# Patient Record
Sex: Female | Born: 1985 | Race: White | Hispanic: No | Marital: Single | Smoking: Current every day smoker
Health system: Southern US, Community
[De-identification: ages and names within clinical notes are randomized; demographics above are authoritative.]

## PROBLEM LIST (undated history)

## (undated) ENCOUNTER — Inpatient Hospital Stay (HOSPITAL_COMMUNITY): Payer: Self-pay

## (undated) DIAGNOSIS — J45909 Unspecified asthma, uncomplicated: Secondary | ICD-10-CM

## (undated) HISTORY — DX: Unspecified asthma, uncomplicated: J45.909

## (undated) HISTORY — PX: TONSILLECTOMY: SUR1361

---

## 2005-02-02 ENCOUNTER — Inpatient Hospital Stay (HOSPITAL_COMMUNITY): Admission: AD | Admit: 2005-02-02 | Discharge: 2005-02-02 | Payer: Self-pay | Admitting: *Deleted

## 2006-02-08 ENCOUNTER — Encounter: Admission: RE | Admit: 2006-02-08 | Discharge: 2006-02-08 | Payer: Self-pay | Admitting: General Practice

## 2009-01-22 ENCOUNTER — Ambulatory Visit: Payer: Self-pay | Admitting: Diagnostic Radiology

## 2009-01-22 ENCOUNTER — Emergency Department (HOSPITAL_BASED_OUTPATIENT_CLINIC_OR_DEPARTMENT_OTHER): Admission: EM | Admit: 2009-01-22 | Discharge: 2009-01-22 | Payer: Self-pay | Admitting: Emergency Medicine

## 2009-01-23 ENCOUNTER — Ambulatory Visit: Payer: Self-pay | Admitting: Radiology

## 2009-01-23 ENCOUNTER — Emergency Department (HOSPITAL_BASED_OUTPATIENT_CLINIC_OR_DEPARTMENT_OTHER): Admission: EM | Admit: 2009-01-23 | Discharge: 2009-01-23 | Payer: Self-pay | Admitting: Emergency Medicine

## 2009-08-02 ENCOUNTER — Emergency Department: Payer: Self-pay | Admitting: Emergency Medicine

## 2010-01-20 ENCOUNTER — Inpatient Hospital Stay (HOSPITAL_COMMUNITY): Admission: AD | Admit: 2010-01-20 | Discharge: 2010-01-23 | Payer: Self-pay | Admitting: Obstetrics and Gynecology

## 2010-08-06 LAB — CBC
HCT: 26.8 % — ABNORMAL LOW (ref 36.0–46.0)
Hemoglobin: 9.1 g/dL — ABNORMAL LOW (ref 12.0–15.0)
MCH: 29.6 pg (ref 26.0–34.0)
MCV: 87.5 fL (ref 78.0–100.0)
RBC: 3.06 MIL/uL — ABNORMAL LOW (ref 3.87–5.11)
WBC: 31.2 10*3/uL — ABNORMAL HIGH (ref 4.0–10.5)

## 2010-08-07 LAB — CBC
HCT: 33.3 % — ABNORMAL LOW (ref 36.0–46.0)
Hemoglobin: 11.1 g/dL — ABNORMAL LOW (ref 12.0–15.0)
MCH: 29.1 pg (ref 26.0–34.0)
MCHC: 33.4 g/dL (ref 30.0–36.0)
MCV: 87.1 fL (ref 78.0–100.0)
RDW: 12.3 % (ref 11.5–15.5)

## 2010-08-28 LAB — URINALYSIS, ROUTINE W REFLEX MICROSCOPIC
Bilirubin Urine: NEGATIVE
Glucose, UA: NEGATIVE mg/dL
Ketones, ur: >80 mg/dL — AB
Nitrite: NEGATIVE
Specific Gravity, Urine: 1.018 (ref 1.005–1.030)
pH: 6.5 (ref 5.0–8.0)

## 2010-08-28 LAB — DIFFERENTIAL
Basophils Absolute: 0.2 10*3/uL — ABNORMAL HIGH (ref 0.0–0.1)
Eosinophils Absolute: 0.2 10*3/uL (ref 0.0–0.7)
Lymphocytes Relative: 9 % — ABNORMAL LOW (ref 12–46)
Monocytes Relative: 7 % (ref 3–12)
Neutrophils Relative %: 82 % — ABNORMAL HIGH (ref 43–77)

## 2010-08-28 LAB — CBC
MCHC: 33.9 g/dL (ref 30.0–36.0)
RDW: 11.6 % (ref 11.5–15.5)

## 2010-08-28 LAB — BASIC METABOLIC PANEL
BUN: 10 mg/dL (ref 6–23)
Calcium: 9.9 mg/dL (ref 8.4–10.5)
GFR calc non Af Amer: >60 mL/min (ref >60)
Glucose, Bld: 125 mg/dL — ABNORMAL HIGH (ref 70–99)
Potassium: 3.3 mEq/L — ABNORMAL LOW (ref 3.5–5.1)
Sodium: 140 mEq/L (ref 135–145)

## 2010-08-28 LAB — URINE MICROSCOPIC-ADD ON

## 2010-08-28 LAB — PREGNANCY, URINE

## 2010-08-28 LAB — D-DIMER, QUANTITATIVE

## 2010-08-28 LAB — PROTIME-INR
INR: 1.1 (ref 0.00–1.49)
Prothrombin Time: 14.3 seconds (ref 11.6–15.2)

## 2012-07-21 ENCOUNTER — Emergency Department (HOSPITAL_COMMUNITY): Payer: No Typology Code available for payment source

## 2012-07-21 ENCOUNTER — Encounter (HOSPITAL_COMMUNITY): Payer: Self-pay | Admitting: Emergency Medicine

## 2012-07-21 ENCOUNTER — Emergency Department (HOSPITAL_COMMUNITY)
Admission: EM | Admit: 2012-07-21 | Discharge: 2012-07-21 | Disposition: A | Discharge status: Home / Self Care | Payer: No Typology Code available for payment source | Attending: Emergency Medicine | Admitting: Emergency Medicine

## 2012-07-21 DIAGNOSIS — Y92410 Unspecified street and highway as the place of occurrence of the external cause: Secondary | ICD-10-CM | POA: Insufficient documentation

## 2012-07-21 DIAGNOSIS — R51 Headache: Secondary | ICD-10-CM | POA: Insufficient documentation

## 2012-07-21 DIAGNOSIS — S335XXA Sprain of ligaments of lumbar spine, initial encounter: Secondary | ICD-10-CM | POA: Insufficient documentation

## 2012-07-21 DIAGNOSIS — Y9389 Activity, other specified: Secondary | ICD-10-CM | POA: Insufficient documentation

## 2012-07-21 MED ORDER — IBUPROFEN 800 MG PO TABS: 800.0000 mg | ORAL_TABLET | Freq: Three times a day (TID) | ORAL | Status: DC

## 2012-07-21 MED ORDER — IBUPROFEN 800 MG PO TABS
800.0000 mg | ORAL_TABLET | Freq: Once | ORAL | Status: AC
  Administered 2012-07-21: 800 mg via ORAL
  Filled 2012-07-21: qty 1

## 2012-07-21 NOTE — Progress Notes (Signed)
During [facility] ED 07/21/12 visit pt was seen by [facility] liaison  Pt offered services to assist with finding a [place] self pay provider, resources & health reform information  
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
## 2012-07-21 NOTE — ED Notes (Signed)
Pt ambulated to restroom. 

## 2012-07-21 NOTE — ED Provider Notes (Signed)
Medical screening examination/treatment/procedure(s) were performed by non-physician practitioner and as supervising physician I was immediately available for consultation/collaboration.  Ethelda Chick, MD 07/21/12 2006

## 2012-08-22 ENCOUNTER — Emergency Department (HOSPITAL_COMMUNITY)
Admission: EM | Admit: 2012-08-22 | Discharge: 2012-08-22 | Disposition: A | Discharge status: Home / Self Care | Payer: Medicaid Other | Attending: Emergency Medicine | Admitting: Emergency Medicine

## 2012-08-22 ENCOUNTER — Encounter (HOSPITAL_COMMUNITY): Payer: Self-pay | Admitting: *Deleted

## 2012-08-22 ENCOUNTER — Emergency Department (HOSPITAL_COMMUNITY): Payer: Medicaid Other

## 2012-08-22 DIAGNOSIS — J3489 Other specified disorders of nose and nasal sinuses: Secondary | ICD-10-CM | POA: Insufficient documentation

## 2012-08-22 DIAGNOSIS — B9789 Other viral agents as the cause of diseases classified elsewhere: Secondary | ICD-10-CM | POA: Insufficient documentation

## 2012-08-22 DIAGNOSIS — R52 Pain, unspecified: Secondary | ICD-10-CM | POA: Insufficient documentation

## 2012-08-22 DIAGNOSIS — R112 Nausea with vomiting, unspecified: Secondary | ICD-10-CM | POA: Insufficient documentation

## 2012-08-22 DIAGNOSIS — R51 Headache: Secondary | ICD-10-CM | POA: Insufficient documentation

## 2012-08-22 DIAGNOSIS — R05 Cough: Secondary | ICD-10-CM | POA: Insufficient documentation

## 2012-08-22 DIAGNOSIS — B349 Viral infection, unspecified: Secondary | ICD-10-CM

## 2012-08-22 DIAGNOSIS — R5381 Other malaise: Secondary | ICD-10-CM | POA: Insufficient documentation

## 2012-08-22 DIAGNOSIS — R059 Cough, unspecified: Secondary | ICD-10-CM | POA: Insufficient documentation

## 2012-08-22 LAB — URINALYSIS, ROUTINE W REFLEX MICROSCOPIC
Glucose, UA: NEGATIVE mg/dL
Ketones, ur: >80 mg/dL — AB
pH: 6 (ref 5.0–8.0)

## 2012-08-22 LAB — POCT I-STAT, CHEM 8
BUN: 11 mg/dL (ref 6–23)
Calcium, Ion: 1.21 mmol/L (ref 1.12–1.23)
Chloride: 103 meq/L (ref 96–112)
Creatinine, Ser: 0.8 mg/dL (ref 0.50–1.10)
Glucose, Bld: 121 mg/dL — ABNORMAL HIGH (ref 70–99)
HCT: 41 % (ref 36.0–46.0)
Hemoglobin: 13.9 g/dL (ref 12.0–15.0)
Potassium: 3.7 meq/L (ref 3.5–5.1)
Sodium: 137 meq/L (ref 135–145)
TCO2: 24 mmol/L (ref 0–100)

## 2012-08-22 LAB — URINE MICROSCOPIC-ADD ON

## 2012-08-22 MED ORDER — ONDANSETRON 4 MG PO TBDP
4.0000 mg | ORAL_TABLET | Freq: Once | ORAL | Status: AC
  Administered 2012-08-22: 4 mg via ORAL
  Filled 2012-08-22: qty 1

## 2012-08-22 MED ORDER — KETOROLAC TROMETHAMINE 30 MG/ML IJ SOLN
30.0000 mg | Freq: Once | INTRAMUSCULAR | Status: AC
  Administered 2012-08-22: 30 mg via INTRAMUSCULAR
  Filled 2012-08-22: qty 1

## 2012-08-22 MED ORDER — KETOROLAC TROMETHAMINE 30 MG/ML IJ SOLN: 30.0000 mg | Freq: Once | INTRAMUSCULAR | Status: DC

## 2012-08-22 MED ORDER — PROMETHAZINE HCL 25 MG PO TABS: 25.0000 mg | ORAL_TABLET | Freq: Four times a day (QID) | ORAL | Status: DC | PRN

## 2012-08-22 NOTE — ED Notes (Addendum)
Pt reports that she started with cough, congestion and fever yesterday.  She also vomited. Her daughter is here with the same symptoms.  Last emesis was yesterday.  Pt also with complaints of headache and backache.  Pt is alert, answers questions appropriately.

## 2012-08-22 NOTE — ED Notes (Addendum)
Pt reports n/v denies diarrhea, symptoms began late last night and she has not been able to keep anything down since.  Pt has chills, headache and lower back pain.  Pt daughter was just seen and released from peds ED for same symptoms.  Lungs clear, pt reports cough.  Pt alert oriented X4

## 2012-08-22 NOTE — ED Provider Notes (Signed)
History     CSN: [id number]  Arrival date & time 08/22/12  9629   First MD Initiated Contact with Patient 08/22/12 1116      Chief Complaint  Patient presents with  . Fever  . Influenza  . Emesis    (Consider location/radiation/quality/duration/timing/severity/associated sxs/prior treatment) HPI Comments: Patient presents with a chief complaint of cough, nasal congestion, body aches, headache, and fever.  Symptoms began yesterday and are gradually worsening.  She has not taken her temperature at home.  Temperature upon arrival 100.3 F orally.  She also had one episode of vomiting yesterday.  No vomiting today.  She denies abdominal pain.  Denies diarrhea.  No neck pain or stiffness.  No vision changes.  She has taken Mucinex for her symptoms with mild relief.  Her daughter has had similar symptoms.    The history is provided by the patient.    History reviewed. No pertinent past medical history.  History reviewed. No pertinent past surgical history.  History reviewed. No pertinent family history.  History  Substance Use Topics  . Smoking status: Not on file  . Smokeless tobacco: Not on file  . Alcohol Use: Not on file    OB History   Grav Para Term Preterm Abortions TAB SAB Ect Mult Living                  Review of Systems  Constitutional: Positive for fever, chills, appetite change and fatigue.  HENT: Positive for congestion and rhinorrhea. Negative for neck pain and neck stiffness.   Respiratory: Positive for cough. Negative for shortness of breath.   Gastrointestinal: Positive for nausea and vomiting. Negative for abdominal pain, diarrhea and blood in stool.  Genitourinary: Negative for dysuria, urgency, frequency, hematuria, vaginal bleeding and vaginal discharge.  Skin: Negative for rash.  Neurological: Positive for headaches. Negative for dizziness, syncope and light-headedness.    Allergies  Review of patient's allergies indicates no known allergies.  Home  Medications   Current Outpatient Rx  Name  Route  Sig  Dispense  Refill  . acetaminophen (TYLENOL) 500 MG tablet   Oral   Take 1,000 mg by mouth every 4 (four) hours as needed for pain.         Marland Kitchen Phenylephrine-DM-GG-APAP (MUCINEX FAST-MAX COLD FLU PO)   Oral   Take 1 Package by mouth 2 (two) times daily as needed (for congestion and flu symptoms).           BP 104/54  Pulse 100  Temp(Src) 100.3 F (37.9 C) (Oral)  Resp 20  SpO2 97%  Physical Exam  Nursing note and vitals reviewed. Constitutional: She is oriented to person, place, and time. She appears well-developed and well-nourished. No distress.  HENT:  Head: Normocephalic and atraumatic.  Right Ear: Hearing, tympanic membrane and ear canal normal.  Left Ear: Hearing, tympanic membrane and ear canal normal.  Nose: Mucosal edema and rhinorrhea present.  Mouth/Throat: Uvula is midline, oropharynx is clear and moist and mucous membranes are normal. No oropharyngeal exudate, posterior oropharyngeal edema or posterior oropharyngeal erythema.  Eyes: EOM are normal. Pupils are equal, round, and reactive to light.  Neck: Normal range of motion. Neck supple.  Cardiovascular: Normal rate, regular rhythm and normal heart sounds.   Pulmonary/Chest: Effort normal and breath sounds normal.  Abdominal: Soft. Bowel sounds are normal. She exhibits no distension and no mass. There is no tenderness. There is no rebound, no guarding and no CVA tenderness.  Musculoskeletal: Normal range of  motion.  Neurological: She is alert and oriented to person, place, and time. She has normal strength. No cranial nerve deficit or sensory deficit. Gait normal.  Skin: Skin is warm and dry. She is not diaphoretic.  Psychiatric: She has a normal mood and affect.    ED Course  Procedures (including critical care time)  Labs Reviewed  URINALYSIS, ROUTINE W REFLEX MICROSCOPIC - Abnormal; Notable for the following:    Color, Urine AMBER (*)    APPearance  CLOUDY (*)    Specific Gravity, Urine 1.035 (*)    Hgb urine dipstick SMALL (*)    Bilirubin Urine SMALL (*)    Ketones, ur >80 (*)    Protein, ur 30 (*)    Leukocytes, UA TRACE (*)    All other components within normal limits  URINE MICROSCOPIC-ADD ON - Abnormal; Notable for the following:    Squamous Epithelial / LPF MANY (*)    Bacteria, UA FEW (*)    All other components within normal limits  POCT I-STAT, CHEM 8 - Abnormal; Notable for the following:    Glucose, Bld 121 (*)    All other components within normal limits   Dg Chest 2 View  08/22/2012  *RADIOLOGY REPORT*  Clinical Data: Cough, congestion, fever  CHEST - 2 VIEW  Comparison:  01/23/2009  Findings:  The heart size and mediastinal contours are within normal limits.  Both lungs are clear.  The visualized skeletal structures are unremarkable.  IMPRESSION: No active cardiopulmonary disease.   Original Report Authenticated By: Judie Petit. Miles Costain, M.D.      No diagnosis found.  Reassessed patient.  She reports that she is feeling much better.  Patient able to tolerate PO liquids.  MDM  Patient presenting with fever, cough, and nasal congestion since yesterday.  CXR negative.  No infection on UA.  She did have small hemoglobin on her UA, but she reports that she has been spotting.  I-stat 8 unremarkable.  Patient given Zofran and symptoms improved.  Patient able to tolerate PO liquids.  Patient non toxic appearing.  Patient appears stable for discharge.  Return precautions given.        Pascal Lux [place], PA-C 08/23/12 [phone]

## 2012-08-22 NOTE — ED Notes (Signed)
Pt passed fluid challenge as she has been able to keep 4oz of pepsi down during visit to ED
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
## 2012-08-23 NOTE — ED Provider Notes (Signed)
Medical screening examination/treatment/procedure(s) were performed by non-physician practitioner and as supervising physician I was immediately available for consultation/collaboration.   Pleas Carneal Y. Casimer Russett, MD 08/23/12 2128 

## 2012-08-31 NOTE — ED Provider Notes (Signed)
Medical screening examination/treatment/procedure(s) were performed by non-physician practitioner and as supervising physician I was immediately available for consultation/collaboration.  Jeniyah Menor K Linker, MD 08/31/12 1721 

## 2013-11-15 ENCOUNTER — Encounter (HOSPITAL_COMMUNITY): Payer: Self-pay | Admitting: Emergency Medicine

## 2013-11-15 ENCOUNTER — Emergency Department (HOSPITAL_COMMUNITY)
Admission: EM | Admit: 2013-11-15 | Discharge: 2013-11-16 | Disposition: A | Discharge status: Home / Self Care | Payer: Medicaid Other | Attending: Emergency Medicine | Admitting: Emergency Medicine

## 2013-11-15 DIAGNOSIS — B085 Enteroviral vesicular pharyngitis: Secondary | ICD-10-CM

## 2013-11-15 DIAGNOSIS — F17200 Nicotine dependence, unspecified, uncomplicated: Secondary | ICD-10-CM | POA: Insufficient documentation

## 2013-11-15 DIAGNOSIS — N390 Urinary tract infection, site not specified: Secondary | ICD-10-CM | POA: Insufficient documentation

## 2013-11-15 NOTE — ED Notes (Signed)
Pt states yesterday when she came out of class she started having chills  Pt states since then she has been running a fever  Pt states this morning she got in the shower and started having vomiting  Pt states she went to class but left early because she was having back pain  Pt states this afternoon she started having pain in her left ear and has a sore throat where it hurts to swallow  Pt states she has had a decreased appetite and has felt weak

## 2013-11-16 LAB — CBC WITH DIFFERENTIAL/PLATELET
BASOS PCT: 0 % (ref 0–1)
Basophils Absolute: 0 10*3/uL (ref 0.0–0.1)
EOS ABS: 0 10*3/uL (ref 0.0–0.7)
EOS PCT: 0 % (ref 0–5)
HCT: 36.8 % (ref 36.0–46.0)
Hemoglobin: 12.6 g/dL (ref 12.0–15.0)
Lymphocytes Relative: 6 % — ABNORMAL LOW (ref 12–46)
Lymphs Abs: 1.3 10*3/uL (ref 0.7–4.0)
MCH: 28.1 pg (ref 26.0–34.0)
MCHC: 34.2 g/dL (ref 30.0–36.0)
MCV: 82.1 fL (ref 78.0–100.0)
MONOS PCT: 8 % (ref 3–12)
Monocytes Absolute: 1.7 10*3/uL — ABNORMAL HIGH (ref 0.1–1.0)
NEUTROS PCT: 86 % — AB (ref 43–77)
Neutro Abs: 18.8 10*3/uL — ABNORMAL HIGH (ref 1.7–7.7)
PLATELETS: 310 10*3/uL (ref 150–400)
RBC: 4.48 MIL/uL (ref 3.87–5.11)
RDW: 12.5 % (ref 11.5–15.5)
WBC: 21.8 10*3/uL — ABNORMAL HIGH (ref 4.0–10.5)

## 2013-11-16 LAB — URINALYSIS, ROUTINE W REFLEX MICROSCOPIC
BILIRUBIN URINE: NEGATIVE
Glucose, UA: NEGATIVE mg/dL
KETONES UR: 15 mg/dL — AB
NITRITE: NEGATIVE
PROTEIN: NEGATIVE mg/dL
Specific Gravity, Urine: 1.018 (ref 1.005–1.030)
Urobilinogen, UA: 1 mg/dL (ref 0.0–1.0)
pH: 7 (ref 5.0–8.0)

## 2013-11-16 LAB — URINE MICROSCOPIC-ADD ON

## 2013-11-16 LAB — BASIC METABOLIC PANEL
BUN: 7 mg/dL (ref 6–23)
CALCIUM: 9.2 mg/dL (ref 8.4–10.5)
CO2: 20 mEq/L (ref 19–32)
Chloride: 97 mEq/L (ref 96–112)
Creatinine, Ser: 0.59 mg/dL (ref 0.50–1.10)
GLUCOSE: 85 mg/dL (ref 70–99)
POTASSIUM: 3.5 meq/L — AB (ref 3.7–5.3)
SODIUM: 133 meq/L — AB (ref 137–147)

## 2013-11-16 LAB — POC URINE PREG, ED: Preg Test, Ur: NEGATIVE

## 2013-11-16 MED ORDER — SODIUM CHLORIDE 0.9 % IV BOLUS (SEPSIS)
1000.0000 mL | Freq: Once | INTRAVENOUS | Status: AC
  Administered 2013-11-16: 1000 mL via INTRAVENOUS

## 2013-11-16 MED ORDER — CEFTRIAXONE SODIUM 1 G IJ SOLR
1.0000 g | Freq: Once | INTRAMUSCULAR | Status: AC
  Administered 2013-11-16: 1 g via INTRAVENOUS
  Filled 2013-11-16: qty 10

## 2013-11-16 MED ORDER — ACETAMINOPHEN 325 MG PO TABS
650.0000 mg | ORAL_TABLET | Freq: Once | ORAL | Status: AC
  Administered 2013-11-16: 650 mg via ORAL
  Filled 2013-11-16: qty 2

## 2013-11-16 MED ORDER — KETOROLAC TROMETHAMINE 30 MG/ML IJ SOLN
30.0000 mg | Freq: Once | INTRAMUSCULAR | Status: AC
  Administered 2013-11-16: 30 mg via INTRAVENOUS
  Filled 2013-11-16: qty 1

## 2013-11-16 MED ORDER — CEPHALEXIN 500 MG PO CAPS: 500.0000 mg | ORAL_CAPSULE | Freq: Four times a day (QID) | ORAL | Status: DC

## 2013-11-16 MED ORDER — IBUPROFEN 800 MG PO TABS
800.0000 mg | ORAL_TABLET | Freq: Once | ORAL | Status: DC
  Filled 2013-11-16: qty 1

## 2013-11-16 MED ORDER — FLUCONAZOLE 200 MG PO TABS: 200.0000 mg | ORAL_TABLET | Freq: Every day | ORAL | Status: AC

## 2013-11-16 NOTE — ED Provider Notes (Signed)
CSN: [id number]     Arrival date & time 11/15/13  2229 History   First MD Initiated Contact with Patient 11/16/13 0030     Chief Complaint  Patient presents with  . Chills  . Otalgia   HPI  History provided by patient. Patient is a 28 year old female with history of tonsillectomy presenting with complaints of fever, chills, sore throat and bilateral lower back pains. Symptoms first began yesterday and have been progressively worsening today. She reports continue normal diet. Denies any congestion or cough symptoms. She has severe sore throat worse with swallowing. Also complains of lower back pain. She did use some Tylenol or the day for 16 to help with her back and throat pain. She has not traveled anywhere recently. Denies any known sick contacts. Denies any associated vomiting or diarrhea. No dysuria, hematuria or urinary frequency.    History reviewed. No pertinent past medical history. Past Surgical History  Procedure Laterality Date  . Tonsillectomy     History reviewed. No pertinent family history. History  Substance Use Topics  . Smoking status: Current Every Day Smoker  . Smokeless tobacco: Not on file  . Alcohol Use: No   OB History   Grav Para Term Preterm Abortions TAB SAB Ect Mult Living                 Review of Systems  Constitutional: Positive for fever, chills and appetite change.  HENT: Positive for ear pain and sore throat. Negative for congestion and nosebleeds.   Respiratory: Negative for cough.   Gastrointestinal: Negative for nausea, vomiting, abdominal pain, diarrhea, constipation and blood in stool.  Genitourinary: Negative for dysuria, frequency, hematuria, flank pain, vaginal bleeding and vaginal discharge.  Musculoskeletal: Positive for back pain.  All other systems reviewed and are negative.     Allergies  Review of patient's allergies indicates no known allergies.  Home Medications   Prior to Admission medications   Medication Sig Start  Date End Date Taking? Authorizing Provider  acetaminophen (TYLENOL) 500 MG tablet Take 1,000 mg by mouth every 4 (four) hours as needed for pain.    Historical Provider, MD  Phenylephrine-DM-GG-APAP (MUCINEX FAST-MAX COLD FLU PO) Take 1 Package by mouth 2 (two) times daily as needed (for congestion and flu symptoms).    Historical Provider, MD  promethazine (PHENERGAN) 25 MG tablet Take 1 tablet (25 mg total) by mouth every 6 (six) hours as needed for nausea. 08/22/12   Heather Laisure, PA-C   BP 119/66  Pulse 99  Temp(Src) 100.7 F (38.2 C) (Oral)  Resp 18  Ht 5' (1.524 m)  Wt 102 lb (46.267 kg)  BMI 19.92 kg/m2  SpO2 99%  LMP 10/11/2013 Physical Exam  Nursing note and vitals reviewed. Constitutional: She is oriented to person, place, and time. She appears well-developed and well-nourished. No distress.  HENT:  Head: Normocephalic.  Right Ear: Tympanic membrane normal.  Left Ear: Tympanic membrane normal.  Pharynx is erythematous with several ulcerative lesions. No exudate. No vesicular petechiae. Normal tongue and lips.  Neck: Normal range of motion. Neck supple.  No meningeal signs  Cardiovascular: Normal rate and regular rhythm.   Pulmonary/Chest: Effort normal and breath sounds normal. No respiratory distress. She has no wheezes. She has no rales.  Abdominal: Soft. There is no tenderness. There is no rebound and no guarding.  No CVA tenderness  Musculoskeletal: Normal range of motion.       Thoracic back: Normal.       Lumbar  back: Normal.  Lymphadenopathy:    She has no cervical adenopathy.  Neurological: She is alert and oriented to person, place, and time.  Skin: Skin is warm and dry. No rash noted.  Psychiatric: She has a normal mood and affect. Her behavior is normal.    ED Course  Procedures   COORDINATION OF CARE:  Nursing notes reviewed. Vital signs reviewed. Initial pt interview and examination performed.   Filed Vitals:   11/15/13 2311 11/15/13 2344  BP:  112/73 119/66  Pulse: 94 99  Temp: 100.3 F (37.9 C) 100.7 F (38.2 C)  TempSrc: Oral Oral  Resp: 16 18  Height:  5' (1.524 m)  Weight:  102 lb (46.267 kg)  SpO2: 98% 99%    1:09 AM-patient seen and evaluated. Patient appears uncomfortable. She has low-grade fever. Does not appear severely ill or toxic. Does not appear dehydrated.  Patient reports significant improvements after IV fluids and Toradol. Her examination is concerning for angina. Laboratory testing and UA pending.  UA is concerning for a UTI. Rocephin ordered. Patient also with elevated WBC. She is now feeling significantly better. Did not have any significant CVA tenderness. There is possibilities for early pyelonephritis. She does also have a few yeast and UA. Denies any vaginal discharge or bleeding. At this time she appears well and stable for discharge home with continued outpatient treatment. Will plan for prescription of Keflex with Diflucan. She agrees with this plan. Strict return precautions given.     Treatment plan initiated: Medications  sodium chloride 0.9 % bolus 1,000 mL (0 mLs Intravenous Stopped 11/16/13 0220)  ketorolac (TORADOL) 30 MG/ML injection 30 mg (30 mg Intravenous Given 11/16/13 0126)  cefTRIAXone (ROCEPHIN) 1 g in dextrose 5 % 50 mL IVPB (0 g Intravenous Stopped 11/16/13 0245)  acetaminophen (TYLENOL) tablet 650 mg (650 mg Oral Given 11/16/13 0215)    Results for orders placed during the hospital encounter of 11/15/13  URINALYSIS, ROUTINE W REFLEX MICROSCOPIC      Result Value Ref Range   Color, Urine YELLOW  YELLOW   APPearance CLOUDY (*) CLEAR   Specific Gravity, Urine 1.018  1.005 - 1.030   pH 7.0  5.0 - 8.0   Glucose, UA NEGATIVE  NEGATIVE mg/dL   Hgb urine dipstick SMALL (*) NEGATIVE   Bilirubin Urine NEGATIVE  NEGATIVE   Ketones, ur 15 (*) NEGATIVE mg/dL   Protein, ur NEGATIVE  NEGATIVE mg/dL   Urobilinogen, UA 1.0  0.0 - 1.0 mg/dL   Nitrite NEGATIVE  NEGATIVE   Leukocytes, UA  LARGE (*) NEGATIVE  CBC WITH DIFFERENTIAL      Result Value Ref Range   WBC 21.8 (*) 4.0 - 10.5 K/uL   RBC 4.48  3.87 - 5.11 MIL/uL   Hemoglobin 12.6  12.0 - 15.0 g/dL   HCT 40.936.8  81.136.0 - 91.446.0 %   MCV 82.1  78.0 - 100.0 fL   MCH 28.1  26.0 - 34.0 pg   MCHC 34.2  30.0 - 36.0 g/dL   RDW 78.212.5  95.611.5 - 21.315.5 %   Platelets 310  150 - 400 K/uL   Neutrophils Relative % 86 (*) 43 - 77 %   Neutro Abs 18.8 (*) 1.7 - 7.7 K/uL   Lymphocytes Relative 6 (*) 12 - 46 %   Lymphs Abs 1.3  0.7 - 4.0 K/uL   Monocytes Relative 8  3 - 12 %   Monocytes Absolute 1.7 (*) 0.1 - 1.0 K/uL   Eosinophils Relative  0  0 - 5 %   Eosinophils Absolute 0.0  0.0 - 0.7 K/uL   Basophils Relative 0  0 - 1 %   Basophils Absolute 0.0  0.0 - 0.1 K/uL  BASIC METABOLIC PANEL      Result Value Ref Range   Sodium 133 (*) 137 - 147 mEq/L   Potassium 3.5 (*) 3.7 - 5.3 mEq/L   Chloride 97  96 - 112 mEq/L   CO2 20  19 - 32 mEq/L   Glucose, Bld 85  70 - 99 mg/dL   BUN 7  6 - 23 mg/dL   Creatinine, Ser 1.610.59  0.50 - 1.10 mg/dL   Calcium 9.2  8.4 - 09.610.5 mg/dL   GFR calc non Af Amer >90  >90 mL/min   GFR calc Af Amer >90  >90 mL/min  URINE MICROSCOPIC-ADD ON      Result Value Ref Range   Squamous Epithelial / LPF MANY (*) RARE   WBC, UA 11-20  <3 WBC/hpf   RBC / HPF 0-2  <3 RBC/hpf   Bacteria, UA MANY (*) RARE   Urine-Other MUCOUS PRESENT    POC URINE PREG, ED      Result Value Ref Range   Preg Test, Ur NEGATIVE  NEGATIVE      MDM   Final diagnoses:  Herpangina  UTI (lower urinary tract infection)       Angus SellerPeter S Dammen, PA-C 11/16/13 [phone]

## 2013-11-16 NOTE — Discharge Instructions (Signed)
Continues to take Tylenol and ibuprofen for fever and pains. Complaining of fluids to stay hydrated. Followup with a primary care provider for continued evaluation and treatment. Return anytime for changing or worsening symptoms.    Herpangina  Herpangina is a viral illness that causes sores inside the mouth and throat. It can be passed from person to person (contagious). Most cases of herpangina occur in the summer. CAUSES  Herpangina is caused by a virus. This virus can be spread by saliva and mouth-to-mouth contact. It can also be spread through contact with an infected person's stools. It usually takes 3 to 6 days after exposure to show signs of infection. SYMPTOMS   Fever.  Very sore, red throat.  Small blisters in the back of the throat.  Sores inside the mouth, lips, cheeks, and in the throat.  Blisters around the outside of the mouth.  Painful blisters on the palms of the hands and soles of the feet.  Irritability.  Poor appetite.  Dehydration. DIAGNOSIS  This diagnosis is made by a physical exam. Lab tests are usually not required. TREATMENT  This illness normally goes away on its own within 1 week. Medicines may be given to ease your symptoms. HOME CARE INSTRUCTIONS   Avoid salty, spicy, or acidic food and drinks. These foods may make your sores more painful.  If the patient is a baby or young child, weigh your child daily to check for dehydration. Rapid weight loss indicates there is not enough fluid intake. Consult your caregiver immediately.  Ask your caregiver for specific rehydration instructions.  Only take over-the-counter or prescription medicines for pain, discomfort, or fever as directed by your caregiver. SEEK IMMEDIATE MEDICAL CARE IF:   Your pain is not relieved with medicine.  You have signs of dehydration, such as dry lips and mouth, dizziness, dark urine, confusion, or a rapid pulse. MAKE SURE YOU:  Understand these instructions.  Will watch  your condition.  Will get help right away if you are not doing well or get worse. Document Released: 02/06/2003 Document Revised: 08/02/2011 Document Reviewed: 11/30/2010 [facility] Patient Information 2015 [place] [employer], [place]. This information is not intended to replace advice given to you by your health care provider. Make sure you discuss any questions you have with your health care provider.   Urinary Tract Infection A urinary tract infection (UTI) can occur any place along the urinary tract. The tract includes the kidneys, ureters, bladder, and urethra. A type of germ called bacteria often causes a UTI. UTIs are often helped with antibiotic medicine.  HOME CARE   If given, take antibiotics as told by your doctor. Finish them even if you start to feel better.  Drink enough fluids to keep your pee (urine) clear or pale yellow.  Avoid tea, drinks with caffeine, and bubbly (carbonated) drinks.  Pee often. Avoid holding your pee in for a long time.  Pee before and after having sex (intercourse).  Wipe from front to back after you poop (bowel movement) if you are a woman. Use each tissue only once. GET HELP RIGHT AWAY IF:   You have back pain.  You have lower belly (abdominal) pain.  You have chills.  You feel sick to your stomach (nauseous).  You throw up (vomit).  Your burning or discomfort with peeing does not go away.  You have a fever.  Your symptoms are not better in 3 days. MAKE SURE YOU:   Understand these instructions.  Will watch your condition.  Will get help  right away if you are not doing well or get worse. Document Released: 10/27/2007 Document Revised: 02/02/2012 Document Reviewed: 12/09/2011 [facility] Patient Information 2015 [place], [place]. This information is not intended to replace advice given to you by your health care provider. Make sure you discuss any questions you have with your health care provider.

## 2013-11-16 NOTE — ED Notes (Signed)
Pt is in room crying states her lower back pain has gotten worse and that she is now having pain in her lower left abdomen that feels like cramping

## 2013-11-18 NOTE — ED Provider Notes (Signed)
Medical screening examination/treatment/procedure(s) were performed by non-physician practitioner and as supervising physician I was immediately available for consultation/collaboration.   Candyce ChurnJohn David Wofford III, MD 11/18/13 [id number]

## 2015-03-17 ENCOUNTER — Emergency Department
Admission: EM | Admit: 2015-03-17 | Discharge: 2015-03-17 | Disposition: A | Discharge status: Home / Self Care | Payer: No Typology Code available for payment source | Attending: Emergency Medicine | Admitting: Emergency Medicine

## 2015-03-17 ENCOUNTER — Encounter: Payer: Self-pay | Admitting: *Deleted

## 2015-03-17 DIAGNOSIS — S199XXA Unspecified injury of neck, initial encounter: Secondary | ICD-10-CM | POA: Diagnosis not present

## 2015-03-17 DIAGNOSIS — S39012A Strain of muscle, fascia and tendon of lower back, initial encounter: Secondary | ICD-10-CM | POA: Insufficient documentation

## 2015-03-17 DIAGNOSIS — Y92410 Unspecified street and highway as the place of occurrence of the external cause: Secondary | ICD-10-CM | POA: Diagnosis not present

## 2015-03-17 DIAGNOSIS — Y9389 Activity, other specified: Secondary | ICD-10-CM | POA: Diagnosis not present

## 2015-03-17 DIAGNOSIS — T148XXA Other injury of unspecified body region, initial encounter: Secondary | ICD-10-CM

## 2015-03-17 DIAGNOSIS — Z792 Long term (current) use of antibiotics: Secondary | ICD-10-CM | POA: Insufficient documentation

## 2015-03-17 DIAGNOSIS — Z720 Tobacco use: Secondary | ICD-10-CM | POA: Insufficient documentation

## 2015-03-17 DIAGNOSIS — Y998 Other external cause status: Secondary | ICD-10-CM | POA: Insufficient documentation

## 2015-03-17 DIAGNOSIS — S3992XA Unspecified injury of lower back, initial encounter: Secondary | ICD-10-CM | POA: Diagnosis present

## 2015-03-17 DIAGNOSIS — Z3202 Encounter for pregnancy test, result negative: Secondary | ICD-10-CM | POA: Insufficient documentation

## 2015-03-17 LAB — POCT PREGNANCY, URINE: PREG TEST UR: NEGATIVE

## 2015-03-17 MED ORDER — NAPROXEN 500 MG PO TABS: 500.0000 mg | ORAL_TABLET | Freq: Two times a day (BID) | ORAL | Status: DC

## 2015-03-17 MED ORDER — CYCLOBENZAPRINE HCL 10 MG PO TABS: 10.0000 mg | ORAL_TABLET | Freq: Three times a day (TID) | ORAL | Status: AC | PRN
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
## 2015-03-17 NOTE — ED Notes (Signed)

## 2015-03-17 NOTE — Discharge Instructions (Signed)

## 2015-03-17 NOTE — ED Notes (Signed)
AAOx3.  Skin warm and dry.  Moving all extremities equally and strong.l  Ambulates with easy and steady gait.  NAD
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
## 2015-05-20 ENCOUNTER — Emergency Department
Admission: EM | Admit: 2015-05-20 | Discharge: 2015-05-20 | Disposition: A | Discharge status: Home / Self Care | Payer: Medicaid Other | Attending: Emergency Medicine | Admitting: Emergency Medicine

## 2015-05-20 DIAGNOSIS — F17200 Nicotine dependence, unspecified, uncomplicated: Secondary | ICD-10-CM | POA: Insufficient documentation

## 2015-05-20 DIAGNOSIS — Z3202 Encounter for pregnancy test, result negative: Secondary | ICD-10-CM | POA: Insufficient documentation

## 2015-05-20 DIAGNOSIS — R102 Pelvic and perineal pain: Secondary | ICD-10-CM | POA: Diagnosis present

## 2015-05-20 DIAGNOSIS — Z791 Long term (current) use of non-steroidal anti-inflammatories (NSAID): Secondary | ICD-10-CM | POA: Insufficient documentation

## 2015-05-20 DIAGNOSIS — N3001 Acute cystitis with hematuria: Secondary | ICD-10-CM | POA: Diagnosis not present

## 2015-05-20 DIAGNOSIS — Z792 Long term (current) use of antibiotics: Secondary | ICD-10-CM | POA: Diagnosis not present

## 2015-05-20 LAB — CBC WITH DIFFERENTIAL/PLATELET
BASOS ABS: 0.1 10*3/uL (ref 0–0.1)
Basophils Relative: 1 %
EOS ABS: 0.1 10*3/uL (ref 0–0.7)
EOS PCT: 1 %
HCT: 38.4 % (ref 35.0–47.0)
HEMOGLOBIN: 12.8 g/dL (ref 12.0–16.0)
LYMPHS ABS: 3.1 10*3/uL (ref 1.0–3.6)
LYMPHS PCT: 20 %
MCH: 27.9 pg (ref 26.0–34.0)
MCHC: 33.3 g/dL (ref 32.0–36.0)
MCV: 83.8 fL (ref 80.0–100.0)
Monocytes Absolute: 1.5 10*3/uL — ABNORMAL HIGH (ref 0.2–0.9)
Monocytes Relative: 10 %
NEUTROS PCT: 68 %
Neutro Abs: 10.6 10*3/uL — ABNORMAL HIGH (ref 1.4–6.5)
PLATELETS: 390 10*3/uL (ref 150–440)
RBC: 4.58 MIL/uL (ref 3.80–5.20)
RDW: 12.5 % (ref 11.5–14.5)
WBC: 15.4 10*3/uL — AB (ref 3.6–11.0)

## 2015-05-20 LAB — COMPREHENSIVE METABOLIC PANEL
ALK PHOS: 58 U/L (ref 38–126)
ALT: 11 U/L — AB (ref 14–54)
AST: 15 U/L (ref 15–41)
Albumin: 4.4 g/dL (ref 3.5–5.0)
Anion gap: 7 (ref 5–15)
BUN: 17 mg/dL (ref 6–20)
CALCIUM: 9.4 mg/dL (ref 8.9–10.3)
CHLORIDE: 103 mmol/L (ref 101–111)
CO2: 29 mmol/L (ref 22–32)
Creatinine, Ser: 0.72 mg/dL (ref 0.44–1.00)
GFR calc non Af Amer: >60 mL/min (ref >60)
GLUCOSE: 101 mg/dL — AB (ref 65–99)
Potassium: 3.4 mmol/L — ABNORMAL LOW (ref 3.5–5.1)
SODIUM: 139 mmol/L (ref 135–145)
Total Bilirubin: 0.6 mg/dL (ref 0.3–1.2)
Total Protein: 8.3 g/dL — ABNORMAL HIGH (ref 6.5–8.1)

## 2015-05-20 LAB — URINALYSIS COMPLETE WITH MICROSCOPIC (ARMC ONLY)
BACTERIA UA: NONE SEEN
Bilirubin Urine: NEGATIVE
GLUCOSE, UA: NEGATIVE mg/dL
Ketones, ur: NEGATIVE mg/dL
Nitrite: NEGATIVE
PH: 8 (ref 5.0–8.0)
Protein, ur: 30 mg/dL — AB
Specific Gravity, Urine: 1.016 (ref 1.005–1.030)

## 2015-05-20 LAB — PREGNANCY, URINE: PREG TEST UR: NEGATIVE

## 2015-05-20 MED ORDER — CIPROFLOXACIN HCL 500 MG PO TABS
500.0000 mg | ORAL_TABLET | Freq: Two times a day (BID) | ORAL | Status: AC
Start: 2015-05-20 — End: 2015-05-23

## 2015-05-20 MED ORDER — CIPROFLOXACIN HCL 500 MG PO TABS
500.0000 mg | ORAL_TABLET | Freq: Once | ORAL | Status: AC
  Administered 2015-05-20: 500 mg via ORAL
  Filled 2015-05-20: qty 1

## 2015-05-20 NOTE — ED Notes (Signed)
Pt in with lower abd pain and painful urination, also has diarrhea.

## 2015-05-20 NOTE — ED Notes (Signed)
Pt here for abd pain, pt was ambulatory to treatment room and pt is in NAD at this time. Pt will be monitored by staff at all times

## 2015-05-20 NOTE — Discharge Instructions (Signed)

## 2015-05-20 NOTE — ED Provider Notes (Signed)
CSN: [id number]     Arrival date & time 05/20/15  2035 History   First MD Initiated Contact with Patient 05/20/15 2156     Chief Complaint  Patient presents with  . Abdominal Pain     (Consider location/radiation/quality/duration/timing/severity/associated sxs/prior Treatment) HPI 29 year old female presents to the emergency department for evaluation of pelvic pain and pressure. Patient has had was present for 7 days. She has had increasing urinary frequency with turning sensation with urination. She denies any fevers back pain nausea vomiting or diarrhea. She denies any vaginal discharge or bleeding. She feels as if she has a urinary tract infection. She's been drinking lots of fluids with no relief. Pain is moderate, increased with urination.   No past medical history on file. Past Surgical History  Procedure Laterality Date  . Tonsillectomy     No family history on file. Social History  Substance Use Topics  . Smoking status: Current Every Day Smoker  . Smokeless tobacco: Not on file  . Alcohol Use: No   OB History    No data available     Review of Systems  Constitutional: Negative for fever, chills, activity change and fatigue.  HENT: Negative for congestion, sinus pressure and sore throat.   Eyes: Negative for visual disturbance.  Respiratory: Negative for cough, chest tightness and shortness of breath.   Cardiovascular: Negative for chest pain and leg swelling.  Gastrointestinal: Negative for nausea, vomiting, abdominal pain and diarrhea.  Genitourinary: Positive for dysuria and frequency. Negative for flank pain, decreased urine volume, vaginal bleeding and vaginal discharge.  Musculoskeletal: Negative for arthralgias and gait problem.  Skin: Negative for rash.  Neurological: Negative for weakness, numbness and headaches.  Hematological: Negative for adenopathy.  Psychiatric/Behavioral: Negative for behavioral problems, confusion and agitation.      Allergies   Review of patient's allergies indicates no known allergies.  Home Medications   Prior to Admission medications   Medication Sig Start Date End Date Taking? Authorizing Provider  acetaminophen (TYLENOL) 500 MG tablet Take 1,000 mg by mouth every 6 (six) hours as needed for mild pain or headache.     Historical Provider, MD  Aspirin-Salicylamide-Caffeine (BC HEADACHE POWDER PO) Take 1 Package by mouth 2 (two) times daily as needed (pain, headache).    Historical Provider, MD  cephALEXin (KEFLEX) 500 MG capsule Take 1 capsule (500 mg total) by mouth 4 (four) times daily. 11/16/13   Ivonne AndrewPeter Dammen, PA-C  ciprofloxacin (CIPRO) 500 MG tablet Take 1 tablet (500 mg total) by mouth 2 (two) times daily. X 5 days 05/20/15 05/23/15  Evon Slackhomas C Danylah Holden, PA-C  cyclobenzaprine (FLEXERIL) 10 MG tablet Take 1 tablet (10 mg total) by mouth 3 (three) times daily as needed for muscle spasms. 03/17/15   Cari B Triplett, FNP  guaiFENesin-dextromethorphan (ROBITUSSIN DM) 100-10 MG/5ML syrup Take 5 mLs by mouth every 6 (six) hours as needed for cough.    Historical Provider, MD  naproxen (NAPROSYN) 500 MG tablet Take 1 tablet (500 mg total) by mouth 2 (two) times daily with a meal. 03/17/15 03/16/16  Cari B Triplett, FNP   BP 124/66 mmHg  Pulse 71  Temp(Src) 98.8 F (37.1 C) (Oral)  Resp 18  Ht 4\' 11"  (1.499 m)  Wt 48.081 kg  BMI 21.40 kg/m2  SpO2 99%  LMP 04/20/2015 Physical Exam  Constitutional: She is oriented to person, place, and time. She appears well-developed and well-nourished. No distress.  HENT:  Head: Normocephalic and atraumatic.  Mouth/Throat: Oropharynx is clear and  moist.  Eyes: EOM are normal. Pupils are equal, round, and reactive to light. Right eye exhibits no discharge. Left eye exhibits no discharge.  Neck: Normal range of motion. Neck supple.  Cardiovascular: Normal rate, regular rhythm and intact distal pulses.   Pulmonary/Chest: Effort normal and breath sounds normal. No respiratory  distress. She exhibits no tenderness.  Abdominal: Soft. Bowel sounds are normal. She exhibits no distension and no mass. There is tenderness (mild tenderness to palpation of the bladder). There is no rebound and no guarding.  Musculoskeletal: Normal range of motion. She exhibits no edema.  Neurological: She is alert and oriented to person, place, and time. She has normal reflexes.  Skin: Skin is warm and dry.  Psychiatric: She has a normal mood and affect. Her behavior is normal. Thought content normal.    ED Course  Procedures (including critical care time) Labs Review Labs Reviewed  CBC WITH DIFFERENTIAL/PLATELET - Abnormal; Notable for the following:    WBC 15.4 (*)    Neutro Abs 10.6 (*)    Monocytes Absolute 1.5 (*)    All other components within normal limits  COMPREHENSIVE METABOLIC PANEL - Abnormal; Notable for the following:    Potassium 3.4 (*)    Glucose, Bld 101 (*)    Total Protein 8.3 (*)    ALT 11 (*)    All other components within normal limits  URINALYSIS COMPLETEWITH MICROSCOPIC ([facility] ONLY) - Abnormal; Notable for the following:    Color, Urine YELLOW (*)    APPearance TURBID (*)    Hgb urine dipstick 1+ (*)    Protein, ur 30 (*)    Leukocytes, UA 3+ (*)    Squamous Epithelial / LPF 0-5 (*)    All other components within normal limits  PREGNANCY, URINE    Imaging Review No results found. I have personally reviewed and evaluated these images and lab results as part of my medical decision-making.   EKG Interpretation None      MDM   Final diagnoses:  Acute cystitis with hematuria    29 year old female with acute cystitis. She is started on Cipro 500 mg 1 tab by mouth twice a day for 5 days. She will increase fluids, return to the ER for any worsening symptoms urgent changes in her health.    Evon Slack, PA-C 05/20/15 2209  Jennye Moccasin, MD 05/21/15 [phone]

## 2015-06-20 ENCOUNTER — Encounter (HOSPITAL_COMMUNITY): Payer: Self-pay | Admitting: *Deleted

## 2015-06-20 ENCOUNTER — Inpatient Hospital Stay (HOSPITAL_COMMUNITY)
Admission: AD | Admit: 2015-06-20 | Discharge: 2015-06-20 | Disposition: A | Discharge status: Home / Self Care | Payer: Medicaid Other | Source: Ambulatory Visit | Attending: Obstetrics & Gynecology | Admitting: Obstetrics & Gynecology

## 2015-06-20 DIAGNOSIS — Z3201 Encounter for pregnancy test, result positive: Secondary | ICD-10-CM | POA: Diagnosis not present

## 2015-06-20 DIAGNOSIS — Z3200 Encounter for pregnancy test, result unknown: Secondary | ICD-10-CM | POA: Diagnosis present

## 2015-06-20 LAB — POCT PREGNANCY, URINE: Preg Test, Ur: POSITIVE — AB

## 2015-06-20 NOTE — MAU Provider Note (Signed)
Ms.Calianne Judie Petit Mikkelsen is a 30 y.o. G1P0 at [redacted]w[redacted]d who presents to MAU today for pregnancy verification. The patient denies abdominal pain or vaginal bleeding today.   BP 114/73 mmHg  Pulse 90  Temp(Src) 99 F (37.2 C) (Oral)  Resp 16  Ht 5' 0.5" (1.537 m)  Wt 105 lb 9.6 oz (47.9 kg)  BMI 20.28 kg/m2  LMP 05/14/2015  CONSTITUTIONAL: Well-developed, well-nourished female in no acute distress.  ENT: External right and left ear normal.  EYES: EOM intact, conjunctivae normal.  MUSCULOSKELETAL: Normal range of motion.  CARDIOVASCULAR: Regular heart rate RESPIRATORY: Normal effort NEUROLOGICAL: Alert and oriented to person, place, and time.  SKIN: Skin is warm and dry. No rash noted. Not diaphoretic. No erythema. No pallor. PSYCH: Normal mood and affect. Normal behavior. Normal judgment and thought content.  Results for orders placed or performed during the hospital encounter of 06/20/15 (from the past 24 hour(s))  Pregnancy, urine POC     Status: Abnormal   Collection Time: 06/20/15  3:04 PM  Result Value Ref Range   Preg Test, Ur POSITIVE (A) NEGATIVE    A: Positive pregnancy test  P: Discharge home Pregnancy confirmation letter and list of area OB providers given. Plans to [facility] as with previous pregnancies Patient advised to start taking prenatal vitamins First trimester warning signs reviewed Patient may return to MAU as needed or if her condition were to change or worsen   Marny Lowenstein, PA-C  06/20/2015 3:16 PM

## 2015-06-20 NOTE — Discharge Instructions (Signed)
Prenatal Care °WHAT IS PRENATAL CARE?  °Prenatal care is the process of caring for a pregnant woman before she gives birth. Prenatal care makes sure that she and her baby remain as healthy as possible throughout pregnancy. Prenatal care may be provided by a midwife, family practice health care provider, or a childbirth and pregnancy specialist (obstetrician). Prenatal care may include physical examinations, testing, treatments, and education on nutrition, lifestyle, and social support services. °WHY IS PRENATAL CARE SO IMPORTANT?  °Early and consistent prenatal care increases the chance that you and your baby will remain healthy throughout your pregnancy. This type of care also decreases a baby's risk of being born too early (prematurely), or being born smaller than expected (small for gestational age). Any underlying medical conditions you may have that could pose a risk during your pregnancy are discussed during prenatal care visits. You will also be monitored regularly for any new conditions that may arise during your pregnancy so they can be treated quickly and effectively. °WHAT HAPPENS DURING PRENATAL CARE VISITS? °Prenatal care visits may include the following: °Discussion °Tell your health care provider about any new signs or symptoms you have experienced since your last visit. These might include: °· Nausea or vomiting. °· Increased or decreased level of energy. °· Difficulty sleeping. °· Back or leg pain. °· Weight changes. °· Frequent urination. °· Shortness of breath with physical activity. °· Changes in your skin, such as the development of a rash or itchiness. °· Vaginal discharge or bleeding. °· Feelings of excitement or nervousness. °· Changes in your baby's movements. °You may want to write down any questions or topics you want to discuss with your health care provider and bring them with you to your appointment. °Examination °During your first prenatal care visit, you will likely have a complete  physical exam. Your health care provider will often examine your vagina, cervix, and the position of your uterus, as well as check your heart, lungs, and other body systems. As your pregnancy progresses, your health care provider will measure the size of your uterus and your baby's position inside your uterus. He or she may also examine you for early signs of labor. Your prenatal visits may also include checking your blood pressure and, after about 10-12 weeks of pregnancy, listening to your baby's heartbeat. °Testing °Regular testing often includes: °· Urinalysis. This checks your urine for glucose, protein, or signs of infection. °· Blood count. This checks the levels of white and red blood cells in your body. °· Tests for sexually transmitted infections (STIs). Testing for STIs at the beginning of pregnancy is routinely done and is required in many states. °· Antibody testing. You will be checked to see if you are immune to certain illnesses, such as rubella, that can affect a developing fetus. °· Glucose screen. Around 24-28 weeks of pregnancy, your blood glucose level will be checked for signs of gestational diabetes. Follow-up tests may be recommended. °· Group B strep. This is a bacteria that is commonly found inside a woman's vagina. This test will inform your health care provider if you need an antibiotic to reduce the amount of this bacteria in your body prior to labor and childbirth. °· Ultrasound. Many pregnant women undergo an ultrasound screening around 18-20 weeks of pregnancy to evaluate the health of the fetus and check for any developmental abnormalities. °· HIV (human immunodeficiency virus) testing. Early in your pregnancy, you will be screened for HIV. If you are at high risk for HIV, this test   may be repeated during your third trimester of pregnancy. You may be offered other testing based on your age, personal or family medical history, or other factors.  HOW OFTEN SHOULD I PLAN TO SEE MY  HEALTH CARE PROVIDER FOR PRENATAL CARE? Your prenatal care check-up schedule depends on any medical conditions you have before, or develop during, your pregnancy. If you do not have any underlying medical conditions, you will likely be seen for checkups:  Monthly, during the first 6 months of pregnancy.  Twice a month during months 7 and 8 of pregnancy.  Weekly starting in the 9th month of pregnancy and until delivery. If you develop signs of early labor or other concerning signs or symptoms, you may need to see your health care provider more often. Ask your health care provider what prenatal care schedule is best for you. WHAT CAN I DO TO KEEP MYSELF AND MY BABY AS HEALTHY AS POSSIBLE DURING MY PREGNANCY?  Take a prenatal vitamin containing 400 micrograms (0.4 mg) of folic acid every day. Your health care provider may also ask you to take additional vitamins such as iodine, vitamin D, iron, copper, and zinc.  Take 1500-2000 mg of calcium daily starting at your 20th week of pregnancy until you deliver your baby.  Make sure you are up to date on your vaccinations. Unless directed otherwise by your health care provider:  You should receive a tetanus, diphtheria, and pertussis (Tdap) vaccination between the 27th and 36th week of your pregnancy, regardless of when your last Tdap immunization occurred. This helps protect your baby from whooping cough (pertussis) after he or she is born.  You should receive an annual inactivated influenza vaccine (IIV) to help protect you and your baby from influenza. This can be done at any point during your pregnancy.  Eat a well-rounded diet that includes:  Fresh fruits and vegetables.  Lean proteins.  Calcium-rich foods such as milk, yogurt, hard cheeses, and dark, leafy greens.  Whole grain breads.  Do noteat seafood high in mercury, including:  Swordfish.  Tilefish.  Shark.  King mackerel.  More than 6 oz tuna per week.  Do not eat:  Raw  or undercooked meats or eggs.  Unpasteurized foods, such as soft cheeses (brie, blue, or feta), juices, and milks.  Lunch meats.  Hot dogs that have not been heated until they are steaming.  Drink enough water to keep your urine clear or pale yellow. For many women, this may be 10 or more 8 oz glasses of water each day. Keeping yourself hydrated helps deliver nutrients to your baby and may prevent the start of pre-term uterine contractions.  Do not use any tobacco products including cigarettes, chewing tobacco, or electronic cigarettes. If you need help quitting, ask your health care provider.  Do not drink beverages containing alcohol. No safe level of alcohol consumption during pregnancy has been determined.  Do not use any illegal drugs. These can harm your developing baby or cause a miscarriage.  Ask your health care provider or pharmacist before taking any prescription or over-the-counter medicines, herbs, or supplements.  Limit your caffeine intake to no more than 200 mg per day.  Exercise. Unless told otherwise by your health care provider, try to get 30 minutes of moderate exercise most days of the week. Do not  do high-impact activities, contact sports, or activities with a high risk of falling, such as horseback riding or downhill skiing.  Get plenty of rest.  Avoid anything that raises your  body temperature, such as hot tubs and saunas.  If you own a cat, do not empty its litter box. Bacteria contained in cat feces can cause an infection called toxoplasmosis. This can result in serious harm to the fetus.  Stay away from chemicals such as insecticides, lead, mercury, and cleaning or paint products that contain solvents.  Do not have any X-rays taken unless medically necessary.  Take a childbirth and breastfeeding preparation class. Ask your health care provider if you need a referral or recommendation.   This information is not intended to replace advice given to you by  your health care provider. Make sure you discuss any questions you have with your health care provider.   Document Released: 05/13/2003 Document Revised: 05/31/2014 Document Reviewed: 07/25/2013 Elsevier Interactive Patient Education 2016 [employer] First Trimester of Pregnancy The first trimester of pregnancy is from week 1 until the end of week 12 (months 1 through 3). During this time, your baby will begin to develop inside you. At 6-8 weeks, the eyes and face are formed, and the heartbeat can be seen on ultrasound. At the end of 12 weeks, all the baby's organs are formed. Prenatal care is all the medical care you receive before the birth of your baby. Make sure you get good prenatal care and follow all of your doctor's instructions. HOME CARE  Medicines  Take medicine only as told by your doctor. Some medicines are safe and some are not during pregnancy.  Take your prenatal vitamins as told by your doctor.  Take medicine that helps you poop (stool softener) as needed if your doctor says it is okay. Diet  Eat regular, healthy meals.  Your doctor will tell you the amount of weight gain that is right for you.  Avoid raw meat and uncooked cheese.  If you feel sick to your stomach (nauseous) or throw up (vomit):  Eat 4 or 5 small meals a day instead of 3 large meals.  Try eating a few soda crackers.  Drink liquids between meals instead of during meals.  If you have a hard time pooping (constipation):  Eat high-fiber foods like fresh vegetables, fruit, and whole grains.  Drink enough fluids to keep your pee (urine) clear or pale yellow. Activity and Exercise  Exercise only as told by your doctor. Stop exercising if you have cramps or pain in your lower belly (abdomen) or low back.  Try to avoid standing for long periods of time. Move your legs often if you must stand in one place for a long time.  Avoid heavy lifting.  Wear low-heeled shoes. Sit and stand up  straight.  You can have sex unless your doctor tells you not to. Relief of Pain or Discomfort  Wear a good support bra if your breasts are sore.  Take warm water baths (sitz baths) to soothe pain or discomfort caused by hemorrhoids. Use hemorrhoid cream if your doctor says it is okay.  Rest with your legs raised if you have leg cramps or low back pain.  Wear support hose if you have puffy, bulging veins (varicose veins) in your legs. Raise (elevate) your feet for 15 minutes, 3-4 times a day. Limit salt in your diet. Prenatal Care  Schedule your prenatal visits by the twelfth week of pregnancy.  Write down your questions. Take them to your prenatal visits.  Keep all your prenatal visits as told by your doctor. Safety  Wear your seat belt at all times when driving.  Make a  list of emergency phone numbers. The list should include numbers for family, friends, the hospital, and police and fire departments. General Tips  Ask your doctor for a referral to a local prenatal class. Begin classes no later than at the start of month 6 of your pregnancy.  Ask for help if you need counseling or help with nutrition. Your doctor can give you advice or tell you where to go for help.  Do not use hot tubs, steam rooms, or saunas.  Do not douche or use tampons or scented sanitary pads.  Do not cross your legs for long periods of time.  Avoid litter boxes and soil used by cats.  Avoid all smoking, herbs, and alcohol. Avoid drugs not approved by your doctor.  Do not use any tobacco products, including cigarettes, chewing tobacco, and electronic cigarettes. If you need help quitting, ask your doctor. You may get counseling or other support to help you quit.  Visit your dentist. At home, brush your teeth with a soft toothbrush. Be gentle when you floss. GET HELP IF:  You are dizzy.  You have mild cramps or pressure in your lower belly.  You have a nagging pain in your belly area.  You  continue to feel sick to your stomach, throw up, or have watery poop (diarrhea).  You have a bad smelling fluid coming from your vagina.  You have pain with peeing (urination).  You have increased puffiness (swelling) in your face, hands, legs, or ankles. GET HELP RIGHT AWAY IF:   You have a fever.  You are leaking fluid from your vagina.  You have spotting or bleeding from your vagina.  You have very bad belly cramping or pain.  You gain or lose weight rapidly.  You throw up blood. It may look like coffee grounds.  You are around people who have [place] measles, fifth disease, or chickenpox.  You have a very bad headache.  You have shortness of breath.  You have any kind of trauma, such as from a fall or a car accident.   This information is not intended to replace advice given to you by your health care provider. Make sure you discuss any questions you have with your health care provider.   Document Released: 10/27/2007 Document Revised: 05/31/2014 Document Reviewed: 03/20/2013 Elsevier Interactive Patient Education [employer]

## 2015-06-20 NOTE — MAU Note (Signed)
Took a test yesterday, line was very faint.  Wants [place] to look at or retest to confirm

## 2015-09-20 ENCOUNTER — Encounter (HOSPITAL_COMMUNITY): Payer: Self-pay

## 2015-09-20 ENCOUNTER — Inpatient Hospital Stay (HOSPITAL_COMMUNITY)
Admission: AD | Admit: 2015-09-20 | Discharge: 2015-09-20 | Disposition: A | Discharge status: Home / Self Care | Payer: Medicaid Other | Source: Ambulatory Visit | Attending: Obstetrics and Gynecology | Admitting: Obstetrics and Gynecology

## 2015-09-20 DIAGNOSIS — R5383 Other fatigue: Secondary | ICD-10-CM | POA: Insufficient documentation

## 2015-09-20 DIAGNOSIS — Z3A18 18 weeks gestation of pregnancy: Secondary | ICD-10-CM | POA: Insufficient documentation

## 2015-09-20 DIAGNOSIS — O98312 Other infections with a predominantly sexual mode of transmission complicating pregnancy, second trimester: Secondary | ICD-10-CM

## 2015-09-20 DIAGNOSIS — O98812 Other maternal infectious and parasitic diseases complicating pregnancy, second trimester: Secondary | ICD-10-CM | POA: Diagnosis not present

## 2015-09-20 DIAGNOSIS — A5901 Trichomonal vulvovaginitis: Secondary | ICD-10-CM | POA: Diagnosis not present

## 2015-09-20 DIAGNOSIS — A599 Trichomoniasis, unspecified: Secondary | ICD-10-CM | POA: Insufficient documentation

## 2015-09-20 DIAGNOSIS — N888 Other specified noninflammatory disorders of cervix uteri: Secondary | ICD-10-CM

## 2015-09-20 DIAGNOSIS — O4692 Antepartum hemorrhage, unspecified, second trimester: Secondary | ICD-10-CM | POA: Insufficient documentation

## 2015-09-20 DIAGNOSIS — O26892 Other specified pregnancy related conditions, second trimester: Secondary | ICD-10-CM | POA: Diagnosis not present

## 2015-09-20 DIAGNOSIS — O99332 Smoking (tobacco) complicating pregnancy, second trimester: Secondary | ICD-10-CM | POA: Diagnosis not present

## 2015-09-20 DIAGNOSIS — R109 Unspecified abdominal pain: Secondary | ICD-10-CM | POA: Diagnosis present

## 2015-09-20 DIAGNOSIS — N939 Abnormal uterine and vaginal bleeding, unspecified: Secondary | ICD-10-CM | POA: Diagnosis present

## 2015-09-20 LAB — URINALYSIS, ROUTINE W REFLEX MICROSCOPIC
BILIRUBIN URINE: NEGATIVE
Glucose, UA: NEGATIVE mg/dL
KETONES UR: NEGATIVE mg/dL
Nitrite: NEGATIVE
PROTEIN: 30 mg/dL — AB
Specific Gravity, Urine: <1.005 — ABNORMAL LOW (ref 1.005–1.030)
pH: 6.5 (ref 5.0–8.0)

## 2015-09-20 LAB — WET PREP, GENITAL
CLUE CELLS WET PREP: NONE SEEN
SPERM: NONE SEEN
Yeast Wet Prep HPF POC: NONE SEEN

## 2015-09-20 LAB — URINE MICROSCOPIC-ADD ON

## 2015-09-20 LAB — ABO/RH: ABO/RH(D): O POS

## 2015-09-20 MED ORDER — METRONIDAZOLE 500 MG PO TABS
2000.0000 mg | ORAL_TABLET | Freq: Once | ORAL | Status: AC
  Administered 2015-09-20: 2000 mg via ORAL
  Filled 2015-09-20: qty 4

## 2015-09-20 MED ORDER — ACETAMINOPHEN 500 MG PO TABS
1000.0000 mg | ORAL_TABLET | Freq: Once | ORAL | Status: AC
  Administered 2015-09-20: 1000 mg via ORAL
  Filled 2015-09-20: qty 2

## 2015-09-20 NOTE — MAU Provider Note (Signed)
History     CSN: [id number]  Arrival date and time: 09/20/15 [id number]   None     Chief Complaint  Patient presents with  . Abdominal Cramping  . Vaginal Bleeding  . Fatigue   HPI   Dana MaduroSusan M Russell is a 30 y.o. female G2P1001 at [id number] presenting to MAU with abdominal pain. The pain started in the last 24 hours. The pain is located on both sides of her lower abdomen. She has never had this pain before. The pain comes and goes. She has not tried anything for the pain.   She has no new sexual partners.   She is also having vaginal bleeding. It has changed to dark red at this time. "small amount" This started at the same time as the pain. She has never had bleeding in this pregnancy. No recent intercourse.   OB History    Gravida Para Term Preterm AB TAB SAB Ectopic Multiple Living   2 1 1       1       History reviewed. No pertinent past medical history.  Past Surgical History  Procedure Laterality Date  . Tonsillectomy      History reviewed. No pertinent family history.  Social History  Substance Use Topics  . Smoking status: Current Every Day Smoker  . Smokeless tobacco: None  . Alcohol Use: No    Allergies: No Known Allergies  Prescriptions prior to admission  Medication Sig Dispense Refill Last Dose  . acetaminophen (TYLENOL) 500 MG tablet Take 1,000 mg by mouth every 6 (six) hours as needed for mild pain or headache.    11/15/2013 at Unknown time  . cyclobenzaprine (FLEXERIL) 10 MG tablet Take 1 tablet (10 mg total) by mouth 3 (three) times daily as needed for muscle spasms. 30 tablet 0    Results for orders placed or performed during the hospital encounter of 09/20/15 (from the past 48 hour(s))  Urinalysis, Routine w reflex microscopic ([facility])     Status: Abnormal   Collection Time: 09/20/15  3:45 AM  Result Value Ref Range   Color, Urine YELLOW YELLOW   APPearance CLEAR CLEAR   Specific Gravity, Urine <1.005 (L) 1.005 - 1.030   pH 6.5 5.0 - 8.0   Glucose, UA NEGATIVE NEGATIVE mg/dL   Hgb urine dipstick LARGE (A) NEGATIVE   Bilirubin Urine NEGATIVE NEGATIVE   Ketones, ur NEGATIVE NEGATIVE mg/dL   Protein, ur 30 (A) NEGATIVE mg/dL   Nitrite NEGATIVE NEGATIVE   Leukocytes, UA LARGE (A) NEGATIVE  Urine microscopic-add on     Status: Abnormal   Collection Time: 09/20/15  3:45 AM  Result Value Ref Range   Squamous Epithelial / LPF 0-5 (A) NONE SEEN   WBC, UA TOO NUMEROUS TO COUNT 0 - 5 WBC/hpf   RBC / HPF TOO NUMEROUS TO COUNT 0 - 5 RBC/hpf   Bacteria, UA FEW (A) NONE SEEN   Trichomonas, UA PRESENT   Wet prep, genital     Status: Abnormal   Collection Time: 09/20/15  4:15 AM  Result Value Ref Range   Yeast Wet Prep HPF POC NONE SEEN NONE SEEN   Trich, Wet Prep PRESENT (A) NONE SEEN   Clue Cells Wet Prep HPF POC NONE SEEN NONE SEEN   WBC, Wet Prep HPF POC MANY (A) NONE SEEN    Comment: MODERATE BACTERIA SEEN   Sperm NONE SEEN   ABO/Rh     Status: None   Collection Time: 09/20/15  4:30  AM  Result Value Ref Range   ABO/RH(D) O POS      Review of Systems  Constitutional: Negative for fever and chills.  Gastrointestinal: Negative for abdominal pain.  Genitourinary: Negative for dysuria.   Physical Exam   Blood pressure 109/62, pulse 82, temperature 98.5 F (36.9 C), resp. rate 18, height  (1.499 m), weight 106 lb (48.081 kg), last menstrual period 05/14/2015.  Physical Exam  Constitutional: She is oriented to person, place, and time. She appears well-developed and well-nourished. No distress.  HENT:  Head: Normocephalic.  Respiratory: Effort normal.  GI: Soft. She exhibits no distension and no mass. There is no tenderness. There is no rebound and no guarding.  Genitourinary:  Speculum exam: Vagina - Small amount of bubbly pink discharge Cervix - friable cervix, +contact bleeding. No active bleeding. Bimanual exam: Cervix closed, no CMT  GC/Chlam, wet prep done Chaperone present for exam.  Musculoskeletal:  Normal range of motion.  Neurological: She is alert and oriented to person, place, and time.  Skin: Skin is warm. She is not diaphoretic.  Psychiatric: Her behavior is normal.    MAU Course  Procedures  None  MDM + fetal hear tones via doppler Wet prep GC  Flagyl 2 grams given PO for the treatment of trichomonas.  Tylenol 2 grams.  Urine culture pending    Assessment and Plan    A:  1. Trichomonas infection   2. Vaginal bleeding in pregnancy, second trimester   3. Friable cervix      P:  Discharge home instable condition Partner needs treatment; no intercourse until 7 days after both parties treated Pelvic rest Bleeding precautions; return to MAU if symptoms worsen Urine culture pending Follow up with OB as scheduled  Condoms always    Dana Lope, NP 09/20/2015 11:14 AM

## 2015-09-20 NOTE — Discharge Instructions (Signed)
Trichomoniasis Trichomoniasis is an infection caused by an organism called Trichomonas. The infection can affect both women and men. In women, the outer female genitalia and the vagina are affected. In men, the penis is mainly affected, but the prostate and other reproductive organs can also be involved. Trichomoniasis is a sexually transmitted infection (STI) and is most often passed to another person through sexual contact.  RISK FACTORS  Having unprotected sexual intercourse.  Having sexual intercourse with an infected partner. SIGNS AND SYMPTOMS  Symptoms of trichomoniasis in women include:  Abnormal gray-green frothy vaginal discharge.  Itching and irritation of the vagina.  Itching and irritation of the area outside the vagina. Symptoms of trichomoniasis in men include:   Penile discharge with or without pain.  Pain during urination. This results from inflammation of the urethra. DIAGNOSIS  Trichomoniasis may be found during a Pap test or physical exam. Your health care provider may use one of the following methods to help diagnose this infection:  Testing the pH of the vagina with a test tape.  Using a vaginal swab test that checks for the Trichomonas organism. A test is available that provides results within a few minutes.  Examining a urine sample.  Testing vaginal secretions. Your health care provider may test you for other STIs, including HIV. TREATMENT   You may be given medicine to fight the infection. Women should inform their health care provider if they could be or are pregnant. Some medicines used to treat the infection should not be taken during pregnancy.  Your health care provider may recommend over-the-counter medicines or creams to decrease itching or irritation.  Your sexual partner will need to be treated if infected.  Your health care provider may test you for infection again 3 months after treatment. HOME CARE INSTRUCTIONS   Take medicines only as  directed by your health care provider.  Take over-the-counter medicine for itching or irritation as directed by your health care provider.  Do not have sexual intercourse while you have the infection.  Women should not douche or wear tampons while they have the infection.  Discuss your infection with your partner. Your partner may have gotten the infection from you, or you may have gotten it from your partner.  Have your sex partner get examined and treated if necessary.  Practice safe, informed, and protected sex.  See your health care provider for other STI testing. SEEK MEDICAL CARE IF:   You still have symptoms after you finish your medicine.  You develop abdominal pain.  You have pain when you urinate.  You have bleeding after sexual intercourse.  You develop a rash.  Your medicine makes you sick or makes you throw up (vomit). MAKE SURE YOU:  Understand these instructions.  Will watch your condition.  Will get help right away if you are not doing well or get worse.   This information is not intended to replace advice given to you by your health care provider. Make sure you discuss any questions you have with your health care provider.   Document Released: 11/03/2000 Document Revised: 05/31/2014 Document Reviewed: 02/19/2013 Elsevier Interactive Patient Education 2016 [employer]  Vaginal Bleeding During Pregnancy, Second Trimester A small amount of bleeding (spotting) from the vagina is relatively common in pregnancy. It usually stops on its own. Various things can cause bleeding or spotting in pregnancy. Some bleeding may be related to the pregnancy, and some may not. Sometimes the bleeding is normal and is not a problem. However, bleeding  can also be a sign of something serious. Be sure to tell your health care provider about any vaginal bleeding right away. Some possible causes of vaginal bleeding during the second trimester include:  Infection, inflammation,  or growths on the cervix.   The placenta may be partially or completely covering the opening of the cervix inside the uterus (placenta previa).  The placenta may have separated from the uterus (abruption of the placenta).   You may be having early (preterm) labor.   The cervix may not be strong enough to keep a baby inside the uterus (cervical insufficiency).   Tiny cysts may have developed in the uterus instead of pregnancy tissue (molar pregnancy). HOME CARE INSTRUCTIONS  Watch your condition for any changes. The following actions may help to lessen any discomfort you are feeling:  Follow your health care provider's instructions for limiting your activity. If your health care provider orders bed rest, you may need to stay in bed and only get up to use the bathroom. However, your health care provider may allow you to continue light activity.  If needed, make plans for someone to help with your regular activities and responsibilities while you are on bed rest.  Keep track of the number of pads you use each day, how often you change pads, and how soaked (saturated) they are. Write this down.  Do not use tampons. Do not douche.  Do not have sexual intercourse or orgasms until approved by your health care provider.  If you pass any tissue from your vagina, save the tissue so you can show it to your health care provider.  Only take over-the-counter or prescription medicines as directed by your health care provider.  Do not take aspirin because it can make you bleed.  Do not exercise or perform any strenuous activities or heavy lifting without your health care provider's permission.  Keep all follow-up appointments as directed by your health care provider. SEEK MEDICAL CARE IF:  You have any vaginal bleeding during any part of your pregnancy.  You have cramps or labor pains.  You have a fever, not controlled by medicine. SEEK IMMEDIATE MEDICAL CARE IF:   You have severe  cramps in your back or belly (abdomen).  You have contractions.  You have chills.  You pass large clots or tissue from your vagina.  Your bleeding increases.  You feel light-headed or weak, or you have fainting episodes.  You are leaking fluid or have a gush of fluid from your vagina. MAKE SURE YOU:  Understand these instructions.  Will watch your condition.  Will get help right away if you are not doing well or get worse.   This information is not intended to replace advice given to you by your health care provider. Make sure you discuss any questions you have with your health care provider.   Document Released: 02/17/2005 Document Revised: 05/15/2013 Document Reviewed: 01/15/2013 Elsevier Interactive Patient Education [employer]

## 2015-09-20 NOTE — MAU Note (Addendum)
Had a lot of back pain Thurs night. 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 Some bleeding since 0400 Friday. Cramping all day Friday and sharp pain in lower and mid abd. Very tired all day Friday. Urinating more than usual. No pain when urinates

## 2015-09-21 LAB — CULTURE, OB URINE
Culture: >=100000 — AB
Special Requests: NORMAL

## 2015-09-22 LAB — GC/CHLAMYDIA PROBE AMP (~~LOC~~) NOT AT ARMC
Chlamydia: NEGATIVE
NEISSERIA GONORRHEA: NEGATIVE

## 2016-04-24 ENCOUNTER — Encounter (HOSPITAL_COMMUNITY): Payer: Self-pay

## 2018-04-13 ENCOUNTER — Emergency Department: Payer: Medicaid Other

## 2018-04-13 ENCOUNTER — Encounter: Payer: Self-pay | Admitting: Emergency Medicine

## 2018-04-13 ENCOUNTER — Emergency Department
Admission: EM | Admit: 2018-04-13 | Discharge: 2018-04-13 | Disposition: A | Discharge status: Home / Self Care | Payer: Medicaid Other | Attending: Emergency Medicine | Admitting: Emergency Medicine

## 2018-04-13 ENCOUNTER — Other Ambulatory Visit: Payer: Self-pay

## 2018-04-13 DIAGNOSIS — Y939 Activity, unspecified: Secondary | ICD-10-CM | POA: Diagnosis not present

## 2018-04-13 DIAGNOSIS — X500XXA Overexertion from strenuous movement or load, initial encounter: Secondary | ICD-10-CM | POA: Diagnosis not present

## 2018-04-13 DIAGNOSIS — Y929 Unspecified place or not applicable: Secondary | ICD-10-CM | POA: Insufficient documentation

## 2018-04-13 DIAGNOSIS — F17210 Nicotine dependence, cigarettes, uncomplicated: Secondary | ICD-10-CM | POA: Insufficient documentation

## 2018-04-13 DIAGNOSIS — Y990 Civilian activity done for income or pay: Secondary | ICD-10-CM | POA: Diagnosis not present

## 2018-04-13 DIAGNOSIS — S99911A Unspecified injury of right ankle, initial encounter: Secondary | ICD-10-CM | POA: Diagnosis present

## 2018-04-13 DIAGNOSIS — S93401A Sprain of unspecified ligament of right ankle, initial encounter: Secondary | ICD-10-CM | POA: Insufficient documentation

## 2018-04-13 NOTE — ED Triage Notes (Signed)
Patient ambulatory to triage with steady gait, without difficulty or distress noted; pt reports exiting bathroom and fell while at work; c/o right ankle pain since; denies desire to file workers comp

## 2018-04-13 NOTE — Discharge Instructions (Signed)
As we discussed, you do not have any broken or dislocated bones in your foot or ankle, but you do have an ankle sprain.  Please read through the included information about routine injury care (RICE = rest, ice, compression, elevation), and take over-the-counter pain medicine according to label instructions.  If you do not have any reason to avoid ibuprofen, you can also consider taking ibuprofen 600 mg 3 times a day with meals, but do this for no more than 5 days as it may cause to some stomach discomfort over time.  Use crutches if provided and you may bear weight as tolerated.  Follow-up is recommended with the orthopedic surgeon or with your regular doctor.  

## 2018-04-13 NOTE — ED Provider Notes (Signed)
[facility] Emergency Department Provider Note  ____________________________________________   First MD Initiated Contact with Patient 04/13/18 [phone]     (approximate)  I have reviewed the triage vital signs and the nursing notes.   HISTORY  Chief Complaint Ankle Pain    HPI Dana Russell is a 32 y.o. female with no contributory past medical history presents for evaluation of pain and swelling in her right ankle.  She twisted and fell at work.  She says that she does not want to file Workmen's Comp.  She is able to bear weight but with a great deal of pain.  The swelling is on the outside of the ankle and there is some bruising.  Taking weight off of the ankle makes it feel better and bearing weight makes it feel worse.  She did not sustain any other injuries.  History reviewed. No pertinent past medical history.  There are no active problems to display for this patient.   Past Surgical History:  Procedure Laterality Date  . TONSILLECTOMY      Prior to Admission medications   Medication Sig Start Date End Date Taking? Authorizing Provider  acetaminophen (TYLENOL) 500 MG tablet Take 1,000 mg by mouth every 6 (six) hours as needed for mild pain or headache.     [provider]  cyclobenzaprine (FLEXERIL) 10 MG tablet Take 1 tablet (10 mg total) by mouth 3 (three) times daily as needed for muscle spasms. 03/17/15   Chinita Pester, FNP    Allergies Patient has no known allergies.  No family history on file.  Social History Social History   Tobacco Use  . Smoking status: Current Every Day Smoker  . Smokeless tobacco: Never Used  Substance Use Topics  . Alcohol use: No  . Drug use: No    Review of Systems Constitutional: No fever/chills Respiratory: Denies shortness of breath. Gastrointestinal: No abdominal pain.  No nausea, no vomiting.   Musculoskeletal: Pain and swelling to outer part of right ankle after twisting at work as  described above. Integumentary: Negative for rash. Neurological: Negative for headaches, focal weakness or numbness.   ____________________________________________   PHYSICAL EXAM:  VITAL SIGNS: ED Triage Vitals  Enc Vitals Group     BP 04/13/18 0545 106/66     Pulse Rate 04/13/18 0545 99     Resp 04/13/18 0545 15     Temp 04/13/18 0545 98.2 F (36.8 C)     Temp Source 04/13/18 0545 Oral     SpO2 04/13/18 0545 99 %     Weight 04/13/18 0542 45.4 kg (100 lb)     Height 04/13/18 0542 1.499 m (4\' 11" )     Head Circumference --      Peak Flow --      Pain Score 04/13/18 0542 8     Pain Loc --      Pain Edu? --      Excl. in GC? --     Constitutional: Alert and oriented. Well appearing and in no acute distress. Eyes: Conjunctivae are normal.  Head: Atraumatic. Cardiovascular: Normal rate, regular rhythm. Good peripheral circulation.  Respiratory: Normal respiratory effort.  No retractions.  Musculoskeletal: Swelling and ecchymosis overlying the right lateral malleolus consistent with sprain, less likely fracture.  Pain with dorsiflexion and plantar flexion and range of motion of the ankle but she is able to bear weight at least minimally.  No other injuries are noted.  She is neurovascularly intact. Neurologic:  Normal  speech and language. No gross focal neurologic deficits are appreciated.  Skin:  Skin is warm, dry and intact. No rash noted. Psychiatric: Mood and affect are normal. Speech and behavior are normal.  ____________________________________________   LABS (all labs ordered are listed, but only abnormal results are displayed)  Labs Reviewed - No data to display ____________________________________________  EKG  No indication for EKG ____________________________________________  RADIOLOGY   ED MD interpretation: No fractures nor dislocations.  Official radiology report(s): Dg Ankle Complete Right  Result Date: 04/13/2018 CLINICAL DATA:  Patient fell at  work. Right ankle pain. EXAM: RIGHT ANKLE - COMPLETE 3+ VIEW COMPARISON:  None. FINDINGS: Right ankle appears intact. No evidence of acute fracture or subluxation. No focal bone lesion or bone destruction. Bone cortex and trabecular architecture appear intact. No radiopaque soft tissue foreign bodies. IMPRESSION: No acute bony abnormalities. Electronically Signed   By: Burman NievesWilliam  Stevens M.D.   On: 04/13/2018 06:33    ____________________________________________   PROCEDURES  Critical Care performed: No   Procedure(s) performed:   Procedures   ____________________________________________   INITIAL IMPRESSION / ASSESSMENT AND PLAN / ED COURSE  As part of my medical decision making, I reviewed the following data within the electronic MEDICAL RECORD NUMBER Nursing notes reviewed and incorporated    No indication of fracture nor dislocation.  Physical exam is consistent with sprain.  I gave her my usual customary sprain management recommendations and return precautions.  She will follow-up with a primary care provider or with orthopedics as needed.      ____________________________________________  FINAL CLINICAL IMPRESSION(S) / ED DIAGNOSES  Final diagnoses:  Sprain of right ankle, unspecified ligament, initial encounter     MEDICATIONS GIVEN DURING THIS VISIT:  Medications - No data to display   ED Discharge Orders    None       Note:  This document was prepared using Dragon voice recognition software and may include unintentional dictation errors.    Loleta RoseForbach, Caryssa Elzey, MD 04/13/18 [phone]

## 2018-06-30 ENCOUNTER — Other Ambulatory Visit: Payer: Self-pay

## 2018-06-30 ENCOUNTER — Emergency Department
Admission: EM | Admit: 2018-06-30 | Discharge: 2018-06-30 | Disposition: A | Discharge status: Home / Self Care | Payer: Medicaid Other | Attending: Emergency Medicine | Admitting: Emergency Medicine

## 2018-06-30 ENCOUNTER — Encounter: Payer: Self-pay | Admitting: Emergency Medicine

## 2018-06-30 DIAGNOSIS — R112 Nausea with vomiting, unspecified: Secondary | ICD-10-CM | POA: Insufficient documentation

## 2018-06-30 DIAGNOSIS — K529 Noninfective gastroenteritis and colitis, unspecified: Secondary | ICD-10-CM | POA: Insufficient documentation

## 2018-06-30 DIAGNOSIS — R197 Diarrhea, unspecified: Secondary | ICD-10-CM | POA: Insufficient documentation

## 2018-06-30 DIAGNOSIS — F17200 Nicotine dependence, unspecified, uncomplicated: Secondary | ICD-10-CM | POA: Insufficient documentation

## 2018-06-30 LAB — URINALYSIS, COMPLETE (UACMP) WITH MICROSCOPIC
BILIRUBIN URINE: NEGATIVE
Bacteria, UA: NONE SEEN
GLUCOSE, UA: NEGATIVE mg/dL
HGB URINE DIPSTICK: NEGATIVE
KETONES UR: 20 mg/dL — AB
Leukocytes, UA: NEGATIVE
NITRITE: NEGATIVE
PROTEIN: NEGATIVE mg/dL
Specific Gravity, Urine: 1.02 (ref 1.005–1.030)
pH: 8 (ref 5.0–8.0)

## 2018-06-30 LAB — CBC
HCT: 41.4 % (ref 36.0–46.0)
Hemoglobin: 13.7 g/dL (ref 12.0–15.0)
MCH: 28.6 pg (ref 26.0–34.0)
MCHC: 33.1 g/dL (ref 30.0–36.0)
MCV: 86.4 fL (ref 80.0–100.0)
Platelets: 388 10*3/uL (ref 150–400)
RBC: 4.79 MIL/uL (ref 3.87–5.11)
RDW: 12.2 % (ref 11.5–15.5)
WBC: 17.7 10*3/uL — AB (ref 4.0–10.5)
nRBC: 0 % (ref 0.0–0.2)

## 2018-06-30 LAB — COMPREHENSIVE METABOLIC PANEL
ALK PHOS: 47 U/L (ref 38–126)
ALT: 12 U/L (ref 0–44)
ANION GAP: 8 (ref 5–15)
AST: 19 U/L (ref 15–41)
Albumin: 4.6 g/dL (ref 3.5–5.0)
BUN: 16 mg/dL (ref 6–20)
CALCIUM: 9.2 mg/dL (ref 8.9–10.3)
CO2: 25 mmol/L (ref 22–32)
CREATININE: 0.66 mg/dL (ref 0.44–1.00)
Chloride: 102 mmol/L (ref 98–111)
Glucose, Bld: 125 mg/dL — ABNORMAL HIGH (ref 70–99)
Potassium: 3.7 mmol/L (ref 3.5–5.1)
Sodium: 135 mmol/L (ref 135–145)
Total Bilirubin: 0.9 mg/dL (ref 0.3–1.2)
Total Protein: 8.1 g/dL (ref 6.5–8.1)

## 2018-06-30 LAB — LIPASE, BLOOD: LIPASE: 27 U/L (ref 11–51)

## 2018-06-30 LAB — POCT PREGNANCY, URINE: Preg Test, Ur: NEGATIVE

## 2018-06-30 MED ORDER — ONDANSETRON 4 MG PO TBDP: 4.0000 mg | ORAL_TABLET | Freq: Four times a day (QID) | ORAL | 0 refills | Status: AC | PRN

## 2018-06-30 MED ORDER — ONDANSETRON 4 MG PO TBDP
4.0000 mg | ORAL_TABLET | Freq: Once | ORAL | Status: AC | PRN
  Administered 2018-06-30: 4 mg via ORAL
  Filled 2018-06-30: qty 1

## 2018-06-30 MED ORDER — OXYCODONE-ACETAMINOPHEN 5-325 MG PO TABS
1.0000 | ORAL_TABLET | ORAL | Status: DC | PRN
  Administered 2018-06-30: 1 via ORAL
  Filled 2018-06-30: qty 1

## 2018-06-30 NOTE — ED Triage Notes (Signed)
Patient reports waking up in the middle of the night with nausea and vomiting. Reports she is also having lots of pain in her lower back and a headache. Patient reports she has not vomited since this morning but her back pain has been consistent. Unsure of fever but reports chills.

## 2018-06-30 NOTE — ED Provider Notes (Signed)
[facility] Emergency Department Provider Note  ____________________________________________  Time seen: Approximately 4:42 PM  I have reviewed the triage vital signs and the nursing notes.   HISTORY  Chief Complaint Emesis and Back Pain   HPI Dana Russell is a 33 y.o. female with no significant past medical history who presents for evaluation of nausea, vomiting, diarrhea.  Patient reports that her symptoms started last night.  She has had several episodes of nonbloody nonbilious emesis and watery diarrhea.  Since this morning has had a low-grade fever and chills.  She reports dull pressure-like pain in her bilateral lower back however that has resolved at this time.  No dysuria or hematuria.  No abdominal pain, no chest pain or shortness of breath, no vaginal discharge.  Patient reports that the pain in her back has resolved.  PMH None - reviewed  Past Surgical History:  Procedure Laterality Date  . TONSILLECTOMY      Prior to Admission medications   Medication Sig Start Date End Date Taking? Authorizing Provider  acetaminophen (TYLENOL) 500 MG tablet Take 1,000 mg by mouth every 6 (six) hours as needed for mild pain or headache.     [provider]  cyclobenzaprine (FLEXERIL) 10 MG tablet Take 1 tablet (10 mg total) by mouth 3 (three) times daily as needed for muscle spasms. 03/17/15   Triplett, Cari B, FNP  ondansetron (ZOFRAN ODT) 4 MG disintegrating tablet Take 1 tablet (4 mg total) by mouth every 6 (six) hours as needed. 06/30/18   Nita SickleVeronese, [place], MD    Allergies Patient has no known allergies.  No family history on file.  Social History Social History   Tobacco Use  . Smoking status: Current Every Day Smoker  . Smokeless tobacco: Never Used  Substance Use Topics  . Alcohol use: No  . Drug use: No    Review of Systems  Constitutional: + fever and chills Eyes: Negative for visual changes. ENT: Negative for sore  throat. Neck: No neck pain  Cardiovascular: Negative for chest pain. Respiratory: Negative for shortness of breath. Gastrointestinal: Negative for abdominal pain. + vomiting and  diarrhea. Genitourinary: Negative for dysuria. Musculoskeletal: + bilateral lower back pain. Skin: Negative for rash. Neurological: Negative for headaches, weakness or numbness. Psych: No SI or HI  ____________________________________________   PHYSICAL EXAM:  VITAL SIGNS: ED Triage Vitals [06/30/18 1242]  Enc Vitals Group     BP 126/82     Pulse Rate (!) 101     Resp 16     Temp 100.1 F (37.8 C)     Temp Source Oral     SpO2 99 %     Weight 100 lb (45.4 kg)     Height 4\' 11"  (1.499 m)     Head Circumference      Peak Flow      Pain Score 9     Pain Loc      Pain Edu?      Excl. in GC?     Constitutional: Alert and oriented. Well appearing and in no apparent distress. HEENT:      Head: Normocephalic and atraumatic.         Eyes: Conjunctivae are normal. Sclera is non-icteric.       Mouth/Throat: Mucous membranes are moist.       Neck: Supple with no signs of meningismus. Cardiovascular: Regular rate and rhythm. No murmurs, gallops, or rubs. 2+ symmetrical distal pulses are present in all extremities. No  JVD. Respiratory: Normal respiratory effort. Lungs are clear to auscultation bilaterally. No wheezes, crackles, or rhonchi.  Gastrointestinal: Soft, non tender, and non distended with positive bowel sounds. No rebound or guarding. Genitourinary: No CVA tenderness. Musculoskeletal: Nontender with normal range of motion in all extremities. No edema, cyanosis, or erythema of extremities. Neurologic: Normal speech and language. Face is symmetric. Moving all extremities. No gross focal neurologic deficits are appreciated. Skin: Skin is warm, dry and intact. No rash noted. Psychiatric: Mood and affect are normal. Speech and behavior are normal.  ____________________________________________    LABS (all labs ordered are listed, but only abnormal results are displayed)  Labs Reviewed  COMPREHENSIVE METABOLIC PANEL - Abnormal; Notable for the following components:      Result Value   Glucose, Bld 125 (*)    All other components within normal limits  CBC - Abnormal; Notable for the following components:   WBC 17.7 (*)    All other components within normal limits  URINALYSIS, COMPLETE (UACMP) WITH MICROSCOPIC - Abnormal; Notable for the following components:   Color, Urine YELLOW (*)    APPearance HAZY (*)    Ketones, ur 20 (*)    All other components within normal limits  LIPASE, BLOOD  POCT PREGNANCY, URINE  POC URINE PREG, ED   ____________________________________________  EKG  none  ____________________________________________  RADIOLOGY  none  ____________________________________________   PROCEDURES  Procedure(s) performed: None Procedures Critical Care performed:  None ____________________________________________   INITIAL IMPRESSION / ASSESSMENT AND PLAN / ED COURSE  33 y.o. female with no significant past medical history who presents for evaluation of nausea, vomiting, diarrhea, fever and chills since last night.  Presentation concerning for viral gastroenteritis.  Labs showing leukocytosis consistent with this presentation.  Patient has no abdominal pain or tenderness therefore low suspicion for gallbladder etiology, ovarian etiology, STD, appendicitis.  UA negative for UTI.  Pregnancy test is negative.  CMP and lipase are normal.  Patient received Zofran in triage and is now tolerating p.o. with no further episodes of vomiting.  Will send home with increase oral hydration, bland diet, Zofran, follow-up with primary care doctor.  Discussed return precautions for signs of dehydration or abdominal pain.      As part of my medical decision making, I reviewed the following data within the electronic MEDICAL RECORD NUMBER Nursing notes reviewed and  incorporated, Labs reviewed , Old chart reviewed, Notes from prior ED visits and [place] Controlled Substance Database    Pertinent labs & imaging results that were available during my care of the patient were reviewed by me and considered in my medical decision making (see chart for details).    ____________________________________________   FINAL CLINICAL IMPRESSION(S) / ED DIAGNOSES  Final diagnoses:  Nausea vomiting and diarrhea  Gastroenteritis      NEW MEDICATIONS STARTED DURING THIS VISIT:  ED Discharge Orders         Ordered    ondansetron (ZOFRAN ODT) 4 MG disintegrating tablet  Every 6 hours PRN     06/30/18 1642           Note:  This document was prepared using Dragon voice recognition software and may include unintentional dictation errors.    Don Perking, [place], MD 06/30/18 [phone]

## 2019-04-14 IMAGING — DX DG ANKLE COMPLETE 3+V*R*
3 series · 3 of 3 positions shown · non-contrast
Comparison: None.

CLINICAL DATA: Patient fell at work. Right ankle pain.

EXAM:
RIGHT ANKLE - COMPLETE 3+ VIEW

[ankle ap]
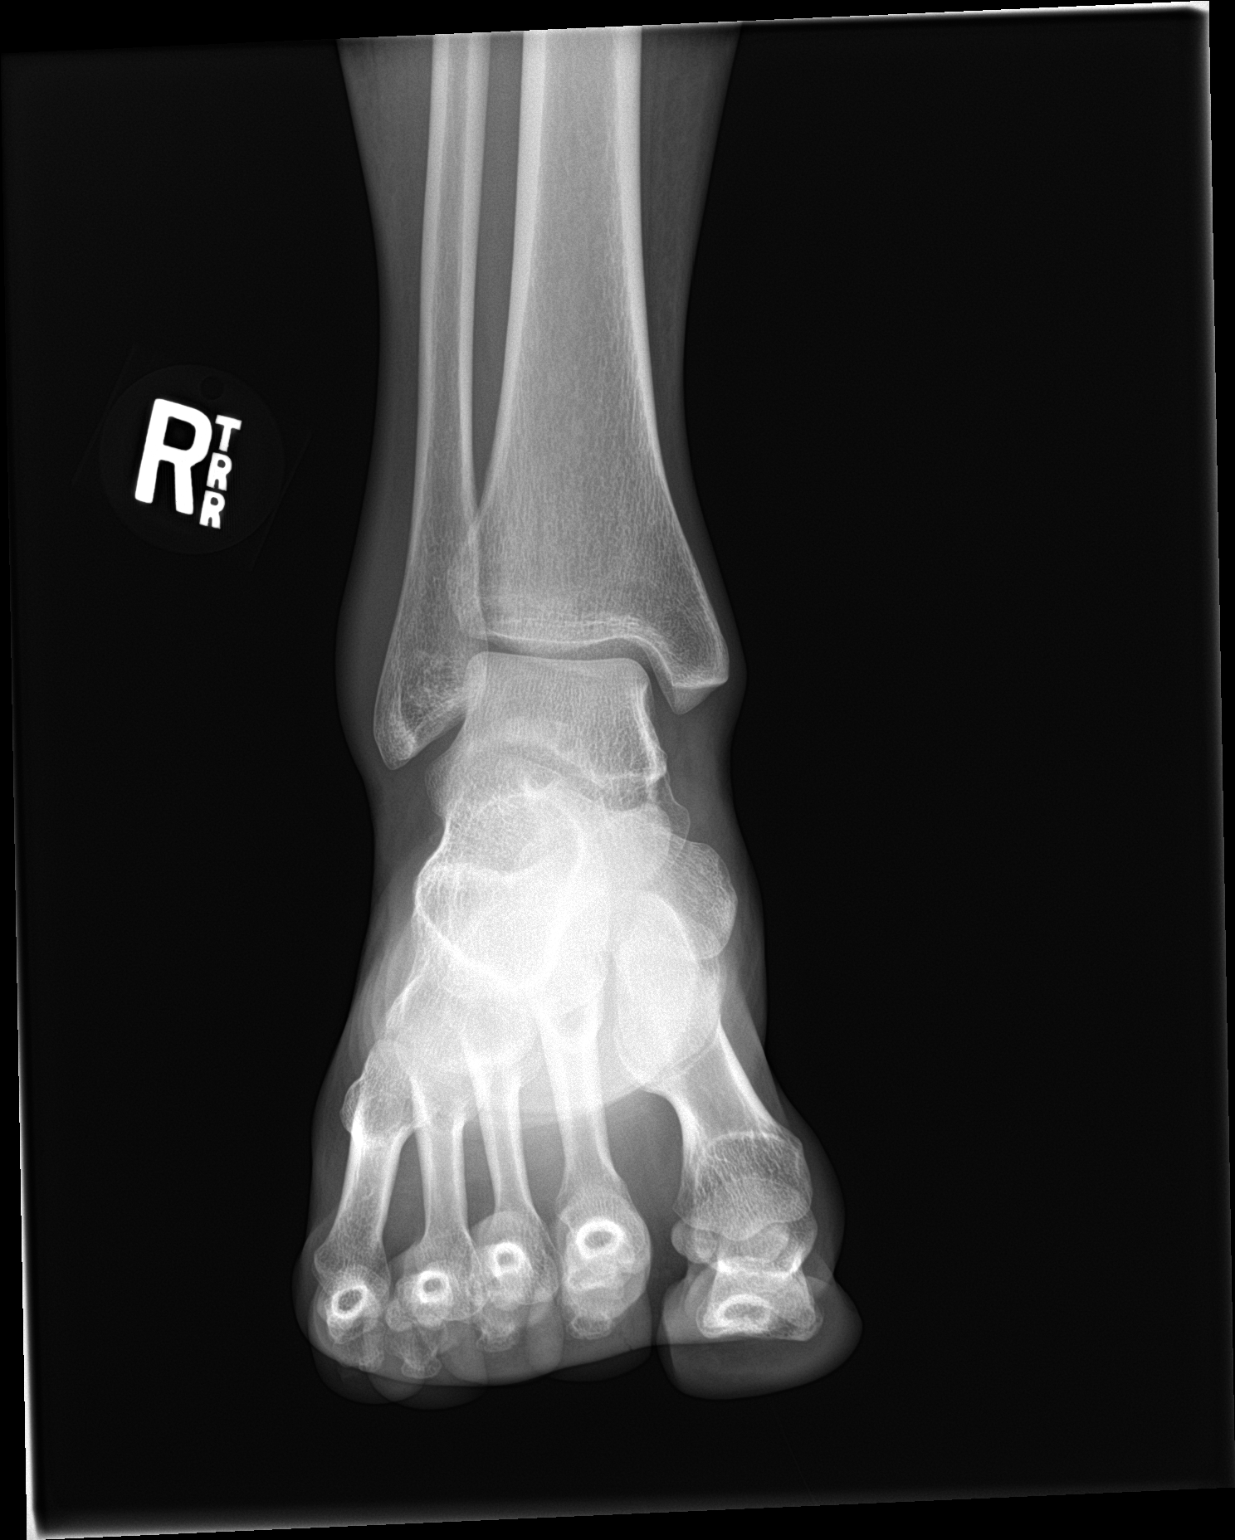

[ankle obl]
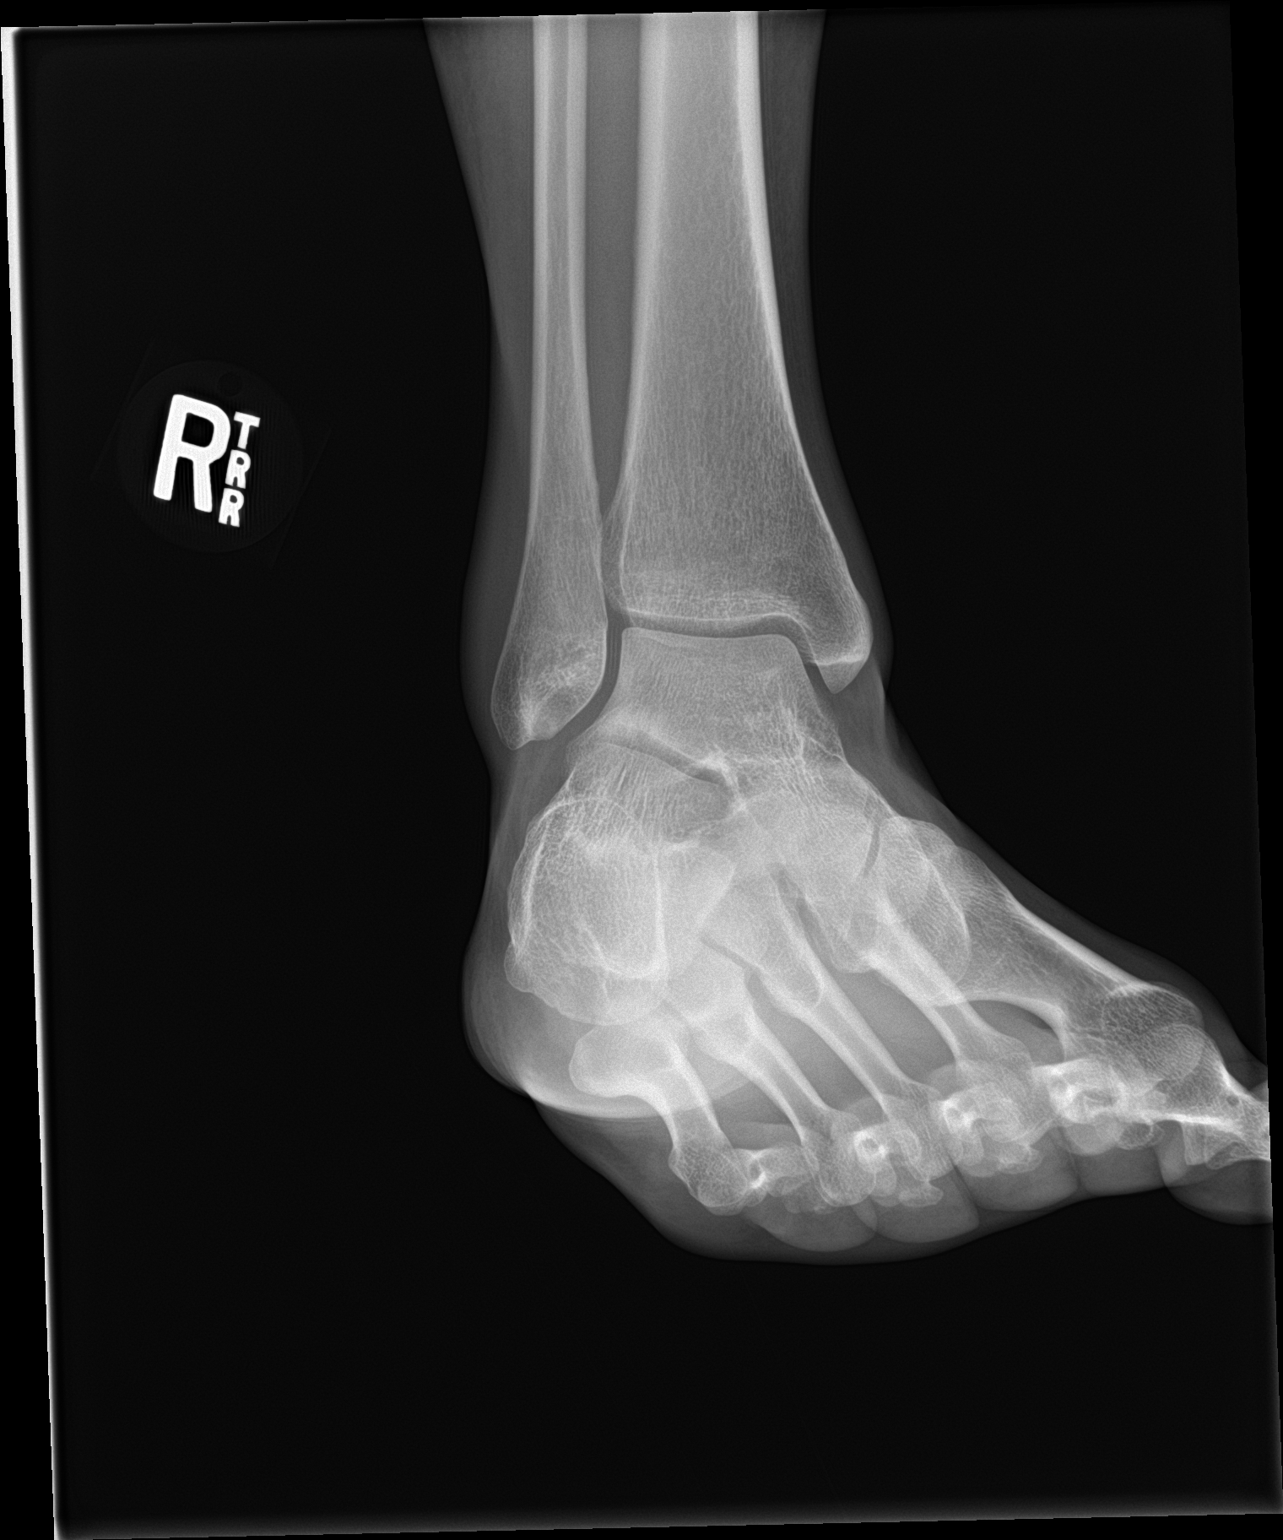

[ankle lat]
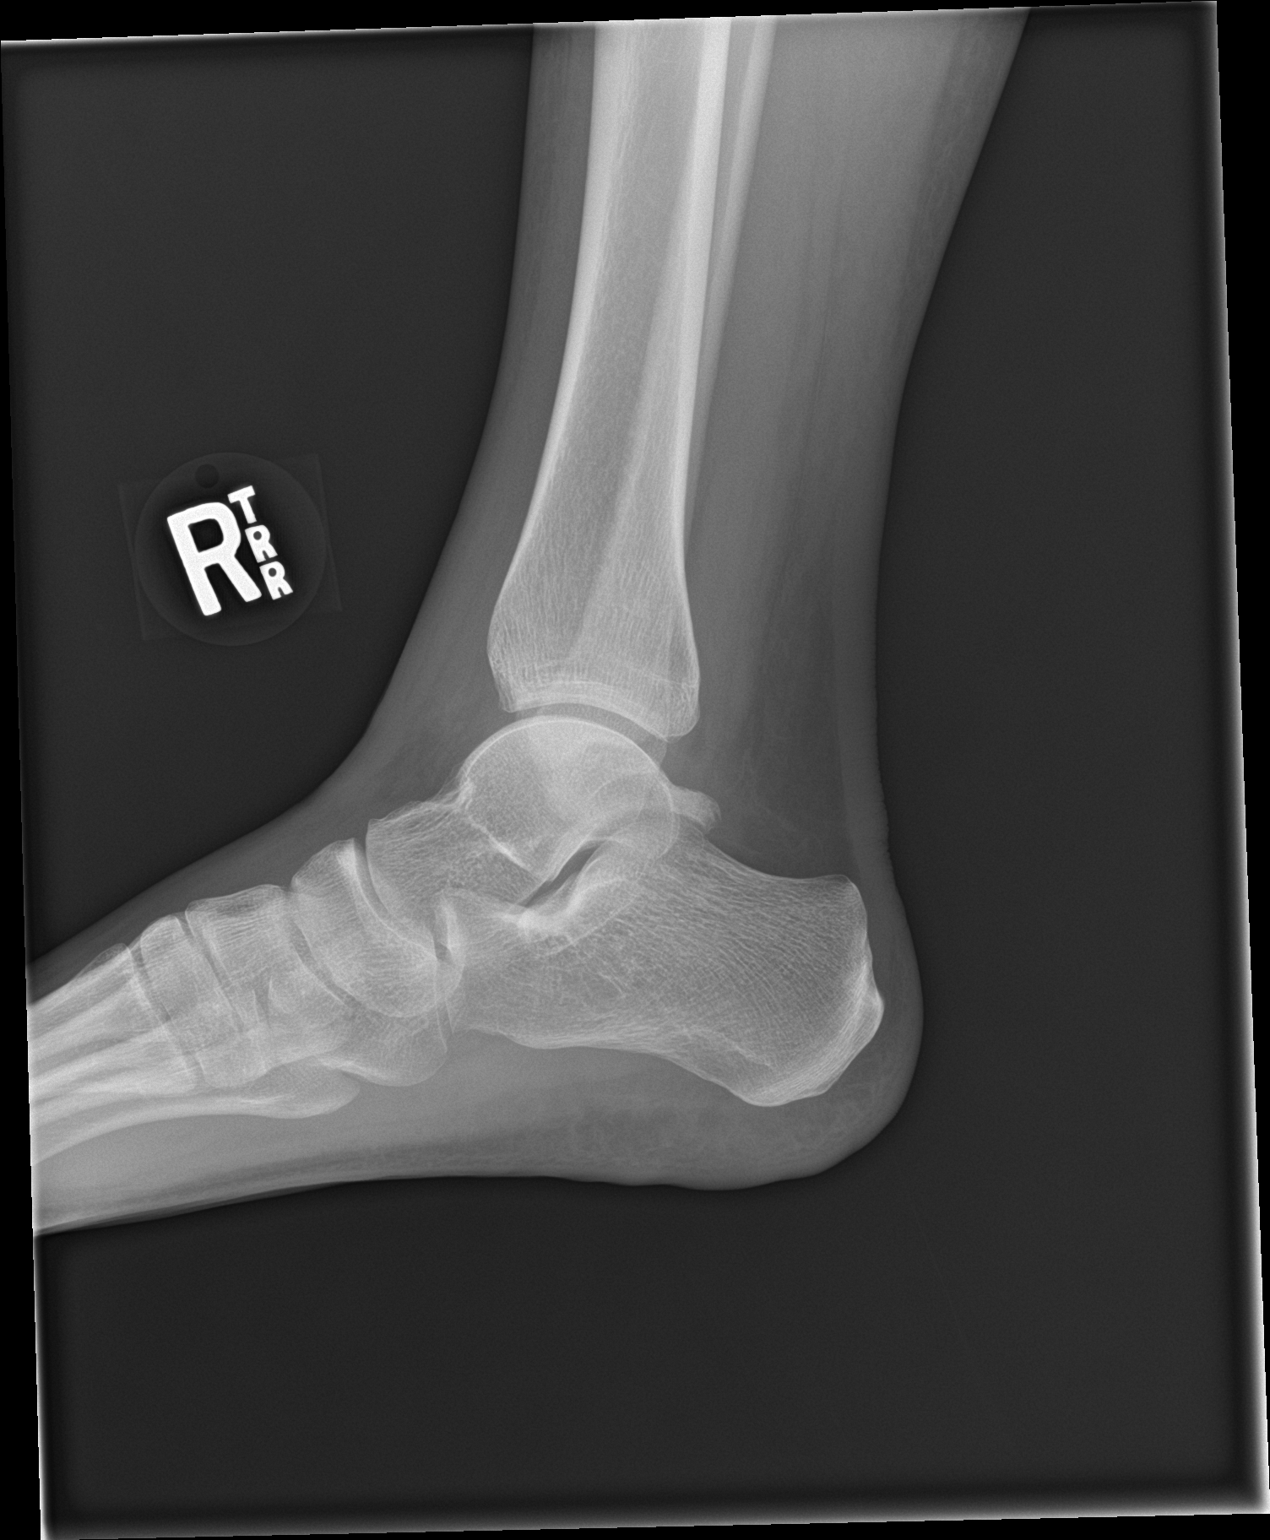

[3 of 3 positions shown; findings below may reference images not displayed]

FINDINGS: Right ankle appears intact. No evidence of acute fracture or
subluxation. No focal bone lesion or bone destruction. Bone cortex
and trabecular architecture appear intact. No radiopaque soft tissue
foreign bodies.
IMPRESSION: No acute bony abnormalities.

## 2022-02-07 ENCOUNTER — Other Ambulatory Visit: Payer: Self-pay

## 2022-02-07 ENCOUNTER — Emergency Department: Payer: Self-pay

## 2022-02-07 DIAGNOSIS — R059 Cough, unspecified: Secondary | ICD-10-CM | POA: Insufficient documentation

## 2022-02-07 DIAGNOSIS — Z20822 Contact with and (suspected) exposure to covid-19: Secondary | ICD-10-CM | POA: Insufficient documentation

## 2022-02-07 DIAGNOSIS — R062 Wheezing: Secondary | ICD-10-CM | POA: Insufficient documentation

## 2022-02-07 DIAGNOSIS — R0602 Shortness of breath: Secondary | ICD-10-CM | POA: Insufficient documentation

## 2022-02-07 LAB — BASIC METABOLIC PANEL
Anion gap: 8 (ref 5–15)
BUN: 18 mg/dL (ref 6–20)
CO2: 26 mmol/L (ref 22–32)
Calcium: 9.1 mg/dL (ref 8.9–10.3)
Chloride: 102 mmol/L (ref 98–111)
Creatinine, Ser: 0.77 mg/dL (ref 0.44–1.00)
GFR, Estimated: >60 mL/min (ref >60)
Glucose, Bld: 111 mg/dL — ABNORMAL HIGH (ref 70–99)
Potassium: 3.2 mmol/L — ABNORMAL LOW (ref 3.5–5.1)
Sodium: 136 mmol/L (ref 135–145)

## 2022-02-07 LAB — TROPONIN I (HIGH SENSITIVITY)
Troponin I (High Sensitivity): 4 ng/L (ref <18)
Troponin I (High Sensitivity): 4 ng/L (ref <18)

## 2022-02-07 LAB — CBC
HCT: 39.1 % (ref 36.0–46.0)
Hemoglobin: 12.9 g/dL (ref 12.0–15.0)
MCH: 28.2 pg (ref 26.0–34.0)
MCHC: 33 g/dL (ref 30.0–36.0)
MCV: 85.4 fL (ref 80.0–100.0)
Platelets: 429 10*3/uL — ABNORMAL HIGH (ref 150–400)
RBC: 4.58 MIL/uL (ref 3.87–5.11)
RDW: 12.1 % (ref 11.5–15.5)
WBC: 13.2 10*3/uL — ABNORMAL HIGH (ref 4.0–10.5)
nRBC: 0 % (ref 0.0–0.2)

## 2022-02-07 NOTE — ED Triage Notes (Signed)
Patient reports increased wheezing for the past week and increased chest pain for the past few days. Reports chills, denies fever. AOX4. Resp even, unlabored on RA.

## 2022-02-08 ENCOUNTER — Emergency Department
Admission: EM | Admit: 2022-02-08 | Discharge: 2022-02-08 | Disposition: A | Discharge status: Home / Self Care | Payer: Self-pay | Attending: Emergency Medicine | Admitting: Emergency Medicine

## 2022-02-08 DIAGNOSIS — R062 Wheezing: Secondary | ICD-10-CM

## 2022-02-08 LAB — SARS CORONAVIRUS 2 BY RT PCR: SARS Coronavirus 2 by RT PCR: NEGATIVE

## 2022-02-08 LAB — HCG, QUANTITATIVE, PREGNANCY: hCG, Beta Chain, Quant, S: 1 m[IU]/mL (ref <5)

## 2022-02-08 LAB — PROCALCITONIN: Procalcitonin: <0.1 ng/mL

## 2022-02-08 MED ORDER — PREDNISONE 20 MG PO TABS: 60.0000 mg | ORAL_TABLET | Freq: Every day | ORAL | 0 refills | Status: DC

## 2022-02-08 MED ORDER — ALBUTEROL SULFATE HFA 108 (90 BASE) MCG/ACT IN AERS: 2.0000 | INHALATION_SPRAY | Freq: Once | RESPIRATORY_TRACT | Status: DC

## 2022-02-08 MED ORDER — POTASSIUM CHLORIDE CRYS ER 20 MEQ PO TBCR
40.0000 meq | EXTENDED_RELEASE_TABLET | Freq: Once | ORAL | Status: AC
  Administered 2022-02-08: 40 meq via ORAL
  Filled 2022-02-08: qty 2

## 2022-02-08 MED ORDER — ALBUTEROL SULFATE (2.5 MG/3ML) 0.083% IN NEBU
5.0000 mg | INHALATION_SOLUTION | Freq: Once | RESPIRATORY_TRACT | Status: AC
  Administered 2022-02-08: 5 mg via RESPIRATORY_TRACT
  Filled 2022-02-08: qty 6

## 2022-02-08 MED ORDER — PREDNISONE 20 MG PO TABS
60.0000 mg | ORAL_TABLET | Freq: Once | ORAL | Status: AC
  Administered 2022-02-08: 60 mg via ORAL
  Filled 2022-02-08: qty 3

## 2022-02-08 MED ORDER — ALBUTEROL SULFATE HFA 108 (90 BASE) MCG/ACT IN AERS: 2.0000 | INHALATION_SPRAY | RESPIRATORY_TRACT | 0 refills | Status: DC | PRN

## 2022-02-08 MED ORDER — IPRATROPIUM BROMIDE 0.02 % IN SOLN
0.5000 mg | Freq: Once | RESPIRATORY_TRACT | Status: AC
  Administered 2022-02-08: 0.5 mg via RESPIRATORY_TRACT
  Filled 2022-02-08: qty 2.5

## 2022-02-08 NOTE — Discharge Instructions (Signed)
Steps to find a Primary Care Provider (PCP):  Call [phone] or [phone] to access "[place] Find a Doctor Service."  2.  You may also go on the [facility] website at [email or web address]  

## 2022-02-08 NOTE — ED Provider Notes (Signed)
[facility] Provider Note    Event Date/Time   First MD Initiated Contact with Patient 02/08/22 0112     (approximate)   History   Shortness of Breath   HPI  Dana Russell is a 36 y.o. female with history of tobacco use who presents to the emergency department with 1 to 2 weeks of nonproductive cough and wheezing.  No history of asthma, COPD.  No fevers, chest pain or shortness of breath.   History provided by patient.    No past medical history on file.  Past Surgical History:  Procedure Laterality Date   TONSILLECTOMY      MEDICATIONS:  Prior to Admission medications   Medication Sig Start Date End Date Taking? Authorizing Provider  acetaminophen (TYLENOL) 500 MG tablet Take 1,000 mg by mouth every 6 (six) hours as needed for mild pain or headache.     [provider]  cyclobenzaprine (FLEXERIL) 10 MG tablet Take 1 tablet (10 mg total) by mouth 3 (three) times daily as needed for muscle spasms. 03/17/15   Triplett, Cari B, FNP  ondansetron (ZOFRAN ODT) 4 MG disintegrating tablet Take 1 tablet (4 mg total) by mouth every 6 (six) hours as needed. 06/30/18   Nita Sickle, MD    Physical Exam   Triage Vital Signs: ED Triage Vitals  Enc Vitals Group     BP 02/07/22 2051 120/76     Pulse Rate 02/07/22 2051 81     Resp 02/07/22 2051 18     Temp 02/07/22 2051 98.5 F (36.9 C)     Temp Source 02/07/22 2051 Oral     SpO2 02/07/22 2051 91 %     Weight 02/07/22 2050 115 lb (52.2 kg)     Height 02/07/22 2050 4\' 11"  (1.499 m)     Head Circumference --      Peak Flow --      Pain Score 02/07/22 2109 7     Pain Loc --      Pain Edu? --      Excl. in GC? --     Most recent vital signs: Vitals:   02/08/22 0128 02/08/22 0236  BP: 118/76 121/78  Pulse: (!) 55 60  Resp: 18 18  Temp:    SpO2: 96% 97%    CONSTITUTIONAL: Alert and oriented and responds appropriately to questions. Well-appearing; well-nourished HEAD: Normocephalic,  atraumatic EYES: Conjunctivae clear, pupils appear equal, sclera nonicteric ENT: normal nose; moist mucous membranes NECK: Supple, normal ROM CARD: RRR; S1 and S2 appreciated; no murmurs, no clicks, no rubs, no gallops RESP: Patient has diffuse inspiratory and expiratory wheezing.  No rhonchi or rales.  No respiratory distress.  Speaking full sentences.  No hypoxia. ABD/GI: Normal bowel sounds; non-distended; soft, non-tender, no rebound, no guarding, no peritoneal signs BACK: The back appears normal EXT: Normal ROM in all joints; no deformity noted, no edema; no cyanosis, no calf tenderness or calf swelling SKIN: Normal color for age and race; warm; no rash on exposed skin NEURO: Moves all extremities equally, normal speech PSYCH: The patient's mood and manner are appropriate.   ED Results / Procedures / Treatments   LABS: (all labs ordered are listed, but only abnormal results are displayed) Labs Reviewed  BASIC METABOLIC PANEL - Abnormal; Notable for the following components:      Result Value   Potassium 3.2 (*)    Glucose, Bld 111 (*)    All other components within normal limits  CBC - Abnormal; Notable for the following components:   WBC 13.2 (*)    Platelets 429 (*)    All other components within normal limits  SARS CORONAVIRUS 2 BY RT PCR  HCG, QUANTITATIVE, PREGNANCY  PROCALCITONIN  TROPONIN I (HIGH SENSITIVITY)  TROPONIN I (HIGH SENSITIVITY)     EKG:  EKG Interpretation  Date/Time:  Sunday February 07 2022 20:57:18 EDT Ventricular Rate:  57 PR Interval:  144 QRS Duration: 82 QT Interval:  402 QTC Calculation: 391 R Axis:   65 Text Interpretation: Sinus bradycardia Otherwise normal ECG No previous ECGs available Confirmed by Rochele Raring [phone]) on 02/08/2022 2:01:57 AM         RADIOLOGY: My personal review and interpretation of imaging: Chest x-ray clear.  I have personally reviewed all radiology reports.   DG Chest 2 View  Result Date:  02/07/2022 CLINICAL DATA:  Shortness of breath. EXAM: CHEST - 2 VIEW COMPARISON:  Chest x-ray 08/22/2012 FINDINGS: The heart size and mediastinal contours are within normal limits. Both lungs are clear. The visualized skeletal structures are unremarkable. IMPRESSION: No active cardiopulmonary disease. Electronically Signed   By: Darliss Cheney M.D.   On: 02/07/2022 21:29     PROCEDURES:  Critical Care performed: No      Procedures    IMPRESSION / MDM / ASSESSMENT AND PLAN / ED COURSE  I reviewed the triage vital signs and the nursing notes.    Patient here with complaints of wheezing and cough.  The patient is on the cardiac monitor to evaluate for evidence of arrhythmia and/or significant heart rate changes.   DIFFERENTIAL DIAGNOSIS (includes but not limited to):   Bronchospasm, bronchitis, pneumonia, COPD, doubt ACS, PE, dissection, CHF   Patient's presentation is most consistent with acute presentation with potential threat to life or bodily function.   PLAN: Initiated from triage.  Patient has slight leukocytosis of 13,000.  Potassium of 3.2.  Negative troponin x2.  COVID-negative.  Chest x-ray reviewed and interpreted by myself and the radiologist and shows no acute abnormality.  Given leukocytosis, we will add on a procalcitonin although my suspicion for bacterial infection is low.  She is afebrile here.  We will give breathing treatments, prednisone for symptomatic relief.Marland Kitchen   MEDICATIONS GIVEN IN ED: Medications  albuterol (VENTOLIN HFA) 108 (90 Base) MCG/ACT inhaler 2 puff (2 puffs Inhalation Patient Refused/Not Given 02/08/22 0236)  albuterol (PROVENTIL) (2.5 MG/3ML) 0.083% nebulizer solution 5 mg (5 mg Nebulization Given 02/08/22 0131)  ipratropium (ATROVENT) nebulizer solution 0.5 mg (0.5 mg Nebulization Given 02/08/22 0131)  predniSONE (DELTASONE) tablet 60 mg (60 mg Oral Given 02/08/22 0131)  albuterol (PROVENTIL) (2.5 MG/3ML) 0.083% nebulizer solution 5 mg (5 mg  Nebulization Given 02/08/22 0224)  ipratropium (ATROVENT) nebulizer solution 0.5 mg (0.5 mg Nebulization Given 02/08/22 0224)  potassium chloride SA (KLOR-CON M) CR tablet 40 mEq (40 mEq Oral Given 02/08/22 0223)     ED COURSE: Calcitonin negative.  Patient feeling better after breathing treatments and requesting discharge home.  Will discharge with albuterol inhaler and prednisone.  Given outpatient follow-up.   At this time, I do not feel there is any life-threatening condition present. I reviewed all nursing notes, vitals, pertinent previous records.  All lab and urine results, EKGs, imaging ordered have been independently reviewed and interpreted by myself.  I reviewed all available radiology reports from any imaging ordered this visit.  Based on my assessment, I feel the patient is safe to be discharged home without further emergent  workup and can continue workup as an outpatient as needed. Discussed all findings, treatment plan as well as usual and customary return precautions.  They verbalize understanding and are comfortable with this plan.  Outpatient follow-up has been provided as needed.  All questions have been answered.   CONSULTS: Admission considered but patient states she is feeling better and requesting discharge home.   OUTSIDE RECORDS REVIEWED: Reviewed patient's previous OB/GYN admission in April 2017.       FINAL CLINICAL IMPRESSION(S) / ED DIAGNOSES   Final diagnoses:  Wheezing     Rx / DC Orders   ED Discharge Orders          Ordered    albuterol (VENTOLIN HFA) 108 (90 Base) MCG/ACT inhaler  Every 4 hours PRN        02/08/22 0233    predniSONE (DELTASONE) 20 MG tablet  Daily        02/08/22 0233             Note:  This document was prepared using Dragon voice recognition software and may include unintentional dictation errors.   Edris Schneck, Delice Bison, DO 02/08/22 0330

## 2022-02-08 NOTE — ED Notes (Signed)
Pt verbalizes understanding discharge instructions. Pt states "I have to go so my daughter can go to school in the morning." Pt refused to wait to receive inhaler.  Pt states "I am feeling much better now." Pt ambulated to private vehicle with daughter.

## 2022-10-27 ENCOUNTER — Other Ambulatory Visit: Payer: Self-pay

## 2022-10-27 ENCOUNTER — Emergency Department: Payer: BLUE CROSS/BLUE SHIELD

## 2022-10-27 ENCOUNTER — Emergency Department
Admission: EM | Admit: 2022-10-27 | Discharge: 2022-10-27 | Disposition: A | Discharge status: Home / Self Care | Payer: BLUE CROSS/BLUE SHIELD | Attending: Emergency Medicine | Admitting: Emergency Medicine

## 2022-10-27 DIAGNOSIS — Z87891 Personal history of nicotine dependence: Secondary | ICD-10-CM | POA: Diagnosis not present

## 2022-10-27 DIAGNOSIS — R062 Wheezing: Secondary | ICD-10-CM | POA: Diagnosis present

## 2022-10-27 DIAGNOSIS — Z1152 Encounter for screening for COVID-19: Secondary | ICD-10-CM | POA: Diagnosis not present

## 2022-10-27 DIAGNOSIS — R0602 Shortness of breath: Secondary | ICD-10-CM | POA: Diagnosis not present

## 2022-10-27 DIAGNOSIS — L509 Urticaria, unspecified: Secondary | ICD-10-CM | POA: Insufficient documentation

## 2022-10-27 LAB — BASIC METABOLIC PANEL
Anion gap: 8 (ref 5–15)
BUN: 14 mg/dL (ref 6–20)
CO2: 25 mmol/L (ref 22–32)
Calcium: 8.8 mg/dL — ABNORMAL LOW (ref 8.9–10.3)
Chloride: 103 mmol/L (ref 98–111)
Creatinine, Ser: 0.63 mg/dL (ref 0.44–1.00)
GFR, Estimated: >60 mL/min (ref >60)
Glucose, Bld: 99 mg/dL (ref 70–99)
Potassium: 3.6 mmol/L (ref 3.5–5.1)
Sodium: 136 mmol/L (ref 135–145)

## 2022-10-27 LAB — TROPONIN I (HIGH SENSITIVITY): Troponin I (High Sensitivity): 3 ng/L (ref <18)

## 2022-10-27 LAB — CBC
HCT: 37.8 % (ref 36.0–46.0)
Hemoglobin: 12.7 g/dL (ref 12.0–15.0)
MCH: 27.7 pg (ref 26.0–34.0)
MCHC: 33.6 g/dL (ref 30.0–36.0)
MCV: 82.5 fL (ref 80.0–100.0)
Platelets: 425 10*3/uL — ABNORMAL HIGH (ref 150–400)
RBC: 4.58 MIL/uL (ref 3.87–5.11)
RDW: 12.6 % (ref 11.5–15.5)
WBC: 10.4 10*3/uL (ref 4.0–10.5)
nRBC: 0 % (ref 0.0–0.2)

## 2022-10-27 LAB — RESP PANEL BY RT-PCR (RSV, FLU A&B, COVID)  RVPGX2
Influenza A by PCR: NEGATIVE
Influenza B by PCR: NEGATIVE
Resp Syncytial Virus by PCR: NEGATIVE
SARS Coronavirus 2 by RT PCR: NEGATIVE

## 2022-10-27 MED ORDER — IPRATROPIUM-ALBUTEROL 0.5-2.5 (3) MG/3ML IN SOLN
3.0000 mL | Freq: Once | RESPIRATORY_TRACT | Status: AC
  Administered 2022-10-27: 3 mL via RESPIRATORY_TRACT
  Filled 2022-10-27: qty 3

## 2022-10-27 MED ORDER — PREDNISONE 10 MG PO TABS: 10.0000 mg | ORAL_TABLET | Freq: Every day | ORAL | 0 refills | Status: AC

## 2022-10-27 MED ORDER — ALBUTEROL SULFATE HFA 108 (90 BASE) MCG/ACT IN AERS: 2.0000 | INHALATION_SPRAY | RESPIRATORY_TRACT | Status: DC | PRN

## 2022-10-27 MED ORDER — FAMOTIDINE IN NACL 20-0.9 MG/50ML-% IV SOLN
20.0000 mg | Freq: Once | INTRAVENOUS | Status: AC
  Administered 2022-10-27: 20 mg via INTRAVENOUS
  Filled 2022-10-27: qty 50

## 2022-10-27 MED ORDER — PREDNISONE 20 MG PO TABS
60.0000 mg | ORAL_TABLET | Freq: Once | ORAL | Status: AC
  Administered 2022-10-27: 60 mg via ORAL
  Filled 2022-10-27: qty 3

## 2022-10-27 MED ORDER — SODIUM CHLORIDE 0.9 % IV SOLN
40.0000 mg | Freq: Once | INTRAVENOUS | Status: DC
  Filled 2022-10-27: qty 4

## 2022-10-27 MED ORDER — EPINEPHRINE 0.3 MG/0.3ML IJ SOAJ
0.3000 mg | Freq: Once | INTRAMUSCULAR | Status: AC
  Administered 2022-10-27: 0.3 mg via INTRAMUSCULAR
  Filled 2022-10-27: qty 0.3

## 2022-10-27 MED ORDER — ALBUTEROL SULFATE HFA 108 (90 BASE) MCG/ACT IN AERS: 1.0000 | INHALATION_SPRAY | Freq: Four times a day (QID) | RESPIRATORY_TRACT | 2 refills | Status: AC | PRN

## 2022-10-27 MED ORDER — DIPHENHYDRAMINE HCL 50 MG/ML IJ SOLN
50.0000 mg | Freq: Once | INTRAMUSCULAR | Status: AC
  Administered 2022-10-27: 50 mg via INTRAVENOUS
  Filled 2022-10-27: qty 1

## 2022-10-27 MED ORDER — FAMOTIDINE 20 MG PO TABS: 40.0000 mg | ORAL_TABLET | Freq: Once | ORAL | Status: DC

## 2022-10-27 NOTE — ED Provider Notes (Signed)
[facility] Provider Note    Event Date/Time   First MD Initiated Contact with Patient 10/27/22 1746     (approximate)   History   Shortness of Breath   HPI  Dana Russell is a 37 y.o. female with no significant past medical history who presents with shortness of breath.  Patient has been sick for several days.  Started about 5 days ago with fever nasal congestion and sneezing.  Today was the first day she felt short of breath feeling she was wheezing.  Has had wheezing in the past when she quit smoking about a year and a half ago and has used her friend's albuterol inhaler in the past but does not carry official diagnosis of asthma or COPD.  She did have a fever several days ago but no fever more recently.  No nausea vomiting diarrhea.  The patient denies hx of prior DVT/PE, unilateral leg pain/swelling, hormone use, recent surgery, hx of cancer, prolonged immobilization.  Patient says she woke up this morning and had a large amount of coughing with small amount of blood in her sputum.  Has not had any recurrent hemoptysis.      No past medical history on file.  There are no problems to display for this patient.    Physical Exam  Triage Vital Signs: ED Triage Vitals  Enc Vitals Group     BP 10/27/22 1555 (!) 122/90     Pulse Rate 10/27/22 1555 88     Resp 10/27/22 1555 20     Temp 10/27/22 1555 98.5 F (36.9 C)     Temp src --      SpO2 10/27/22 1555 94 %     Weight 10/27/22 1556 115 lb 1.3 oz (52.2 kg)     Height --      Head Circumference --      Peak Flow --      Pain Score 10/27/22 1555 7     Pain Loc --      Pain Edu? --      Excl. in GC? --     Most recent vital signs: Vitals:   10/27/22 1555  BP: (!) 122/90  Pulse: 88  Resp: 20  Temp: 98.5 F (36.9 C)  SpO2: 94%     General: Awake, no distress.  CV:  Good peripheral perfusion.  Resp:  Normal effort.  Patient has diffuse inspiratory and expiratory wheezing but is moving  good air has no increased work of breathing Abd:  No distention.  Neuro:             Awake, Alert, Oriented x 3  Other:     ED Results / Procedures / Treatments  Labs (all labs ordered are listed, but only abnormal results are displayed) Labs Reviewed  BASIC METABOLIC PANEL - Abnormal; Notable for the following components:      Result Value   Calcium 8.8 (*)    All other components within normal limits  CBC - Abnormal; Notable for the following components:   Platelets 425 (*)    All other components within normal limits  RESP PANEL BY RT-PCR (RSV, FLU A&B, COVID)  RVPGX2  POC URINE PREG, ED  TROPONIN I (HIGH SENSITIVITY)     EKG  EKG interpretation performed by myself: NSR, nml axis, nml intervals, no acute ischemic changes    RADIOLOGY I reviewed and interpreted the CXR which does not show any acute cardiopulmonary process    PROCEDURES:  Critical Care performed: No  Procedures    MEDICATIONS ORDERED IN ED: Medications  EPINEPHrine (EPI-PEN) injection 0.3 mg (has no administration in time range)  famotidine (PEPCID) 40 mg in sodium chloride 0.9 % 50 mL IVPB (has no administration in time range)  diphenhydrAMINE (BENADRYL) injection 50 mg (has no administration in time range)  ipratropium-albuterol (DUONEB) 0.5-2.5 (3) MG/3ML nebulizer solution 3 mL (3 mLs Nebulization Given 10/27/22 1603)  predniSONE (DELTASONE) tablet 60 mg (60 mg Oral Given 10/27/22 1826)  ipratropium-albuterol (DUONEB) 0.5-2.5 (3) MG/3ML nebulizer solution 3 mL (3 mLs Nebulization Given 10/27/22 1826)     IMPRESSION / MDM / ASSESSMENT AND PLAN / ED COURSE  I reviewed the triage vital signs and the nursing notes.                              Patient's presentation is most consistent with acute complicated illness / injury requiring diagnostic workup.  Differential diagnosis includes, but is not limited to, asthma, COPD, bronchitis, viral induced bronchospasm, low suspicion for PE, CHF  Is a  37 year old female with history of prior tobacco use who presents today with dyspnea.  She has had what sounds like viral upper respiratory tract symptoms for several days including sneezing fever and nasal congestion.  Was doing well she thought until today when she developed dyspnea and wheezing.  She endorses some chest tightness associated with the dyspnea.  She smoked for about 10 years quit last September.  Says she had wheezing after she quit smoking last year does not have albuterol at home but has used her friend's albuterol in the past.  She is no longer smoking.  Patient's initial triage sat is documented at 94%.  When I am in the room she is satting 99%.  She does have inspiratory expiratory wheezing but is moving good air speaking in full sentences and not have any increased work of breathing.  No signs of DVT.  Patient's EKG nonischemic without signs of right heart strain.  Chest x-ray is clear.  Labs are all reassuring including a negative troponin, normal CBC and BMP.  Overall I suspect that this is a viral bronchitis causing bronchospasm.  She received a DuoNeb in triage and was already feeling improved at the time my evaluation.  Will give an additional DuoNeb and prednisone.  Consider diagnosis of PE given the episode of mopped assist patient had however with her wheezing and other symptoms I feel that I have another more likely cause which is likely a viral bronchitis.  I have also added on a COVID and flu test.  On reassessment patient now has urticaria.  Says her wheezing felt improved after the DuoNeb but is now wheezing again.  She has no history of allergies no new exposures but with the new wheezing and urticaria or question whether this could be allergic and thus we will treat as possible anaphylaxis with IM epi.  Will give additional DuoNeb, and will give IV Benadryl and Pepcid.  Has already received p.o. prednisone.   FINAL CLINICAL IMPRESSION(S) / ED DIAGNOSES   Final  diagnoses:  Wheezing     Rx / DC Orders   ED Discharge Orders     None        Note:  This document was prepared using Dragon voice recognition software and may include unintentional dictation errors.   Georga Hacking, MD 10/27/22 [phone]

## 2022-10-27 NOTE — ED Notes (Addendum)
Pt rashed out with hives all over body after breathing treatment and prednisone.    Pt has itching.  Pt with wheezing thruout.  Meds given, iv started and pt placed on monitor.  Md in with pt.

## 2022-10-27 NOTE — ED Notes (Addendum)
Pt feeling better   no rash.    Pt sleepy  siderails up x 2  nsr on monitor.

## 2022-10-27 NOTE — ED Provider Notes (Signed)
Patient reevaluated.  Still having some slight wheezing.  Third DuoNeb treatment ordered.  Vital signs are stable.  Rash completely resolved.  Overall patient states she is 70% better than on arrival Physical Exam  BP 104/70   Pulse 92   Temp 98.5 F (36.9 C) (Oral)   Resp 15   Wt 52.2 kg   SpO2 100%   BMI 23.24 kg/m   Physical Exam Constitutional:      Appearance: She is well-developed.  HENT:     Head: Normocephalic and atraumatic.     Mouth/Throat:     Mouth: Mucous membranes are moist.  Eyes:     Conjunctiva/sclera: Conjunctivae normal.  Cardiovascular:     Rate and Rhythm: Normal rate and regular rhythm.  Pulmonary:     Effort: Pulmonary effort is normal. No respiratory distress.     Breath sounds: Wheezing present.  Musculoskeletal:        General: Normal range of motion.     Cervical back: Normal range of motion.  Skin:    General: Skin is warm.     Findings: No rash.  Neurological:     Mental Status: She is alert and oriented to person, place, and time.  Psychiatric:        Behavior: Behavior normal.        Thought Content: Thought content normal.     Procedures  Procedures  ED Course / MDM    Medical Decision Making Amount and/or Complexity of Data Reviewed Labs: ordered. Radiology: ordered.  Risk Prescription drug management.   37 year old female with viral illness for several days with new onset wheezing/shortness of breath.  Chest x-ray negative.  Viral test negative.  Patient given DuoNeb treatment x 3 and has had complete resolution of her wheezing.  She denies any shortness of breath.  No wheezing on auscultation of the lungs.  Patient did develop some urticarial type rash and was treated with epi and other antihistamine medications, rash is completely resolved.  Patient has been asymptomatic over the last hour and a half.  She appears well, no distress and has had no reoccurring chest tightness/wheezing.  Her vital signs are stable.  Patient  appears stable and ready for discharge home.  She is given a 6-day steroid taper along with albuterol inhaler and she will take continue medications as needed for mild viral symptoms.  She understands signs symptoms return to the ER for.       Evon Slack, PA-C 10/27/22 2329    Georga Hacking, MD 10/28/22 [phone]

## 2022-10-27 NOTE — Discharge Instructions (Signed)
Please take prednisone as prescribed and use albuterol as needed.  Return to the emergency department for any wheezing, shortness of breath or any urgent changes in your health

## 2022-10-27 NOTE — ED Notes (Signed)
Pt feeeling better after iv meds sinus on monitor.  Pt alert.

## 2022-10-27 NOTE — ED Notes (Signed)
Pt wheezing.  Breathing treatment given again.

## 2022-10-27 NOTE — ED Triage Notes (Signed)
Pt states last Friday had congestion, fever and sneezing a lot. Then over the weekend back started to ach and felt like she had the flu. Today started to get SOB and chest pain worse with exertion and taking deep breaths.

## 2023-11-08 DIAGNOSIS — J302 Other seasonal allergic rhinitis: Secondary | ICD-10-CM | POA: Diagnosis not present

## 2023-11-08 DIAGNOSIS — Z13220 Encounter for screening for lipoid disorders: Secondary | ICD-10-CM | POA: Diagnosis not present

## 2023-11-08 DIAGNOSIS — F17210 Nicotine dependence, cigarettes, uncomplicated: Secondary | ICD-10-CM | POA: Diagnosis not present

## 2023-11-08 DIAGNOSIS — Z716 Tobacco abuse counseling: Secondary | ICD-10-CM | POA: Diagnosis not present

## 2023-11-08 DIAGNOSIS — Z131 Encounter for screening for diabetes mellitus: Secondary | ICD-10-CM | POA: Diagnosis not present

## 2023-11-08 DIAGNOSIS — Z113 Encounter for screening for infections with a predominantly sexual mode of transmission: Secondary | ICD-10-CM | POA: Diagnosis not present

## 2023-11-08 DIAGNOSIS — Z124 Encounter for screening for malignant neoplasm of cervix: Secondary | ICD-10-CM | POA: Diagnosis not present

## 2023-11-08 DIAGNOSIS — J4520 Mild intermittent asthma, uncomplicated: Secondary | ICD-10-CM | POA: Diagnosis not present

## 2023-12-06 DIAGNOSIS — L408 Other psoriasis: Secondary | ICD-10-CM | POA: Diagnosis not present

## 2023-12-06 DIAGNOSIS — Z124 Encounter for screening for malignant neoplasm of cervix: Secondary | ICD-10-CM | POA: Diagnosis not present

## 2023-12-06 DIAGNOSIS — J302 Other seasonal allergic rhinitis: Secondary | ICD-10-CM | POA: Diagnosis not present

## 2023-12-06 DIAGNOSIS — J4520 Mild intermittent asthma, uncomplicated: Secondary | ICD-10-CM | POA: Diagnosis not present

## 2024-02-23 ENCOUNTER — Encounter: Payer: Self-pay | Admitting: Obstetrics and Gynecology

## 2024-02-23 ENCOUNTER — Other Ambulatory Visit (HOSPITAL_COMMUNITY)
Admission: RE | Admit: 2024-02-23 | Discharge: 2024-02-23 | Disposition: A | Discharge status: Home / Self Care | Source: Ambulatory Visit | Attending: Obstetrics and Gynecology | Admitting: Obstetrics and Gynecology

## 2024-02-23 ENCOUNTER — Ambulatory Visit: Admitting: Obstetrics and Gynecology

## 2024-02-23 VITALS — BP 94/60 | HR 70 | Ht 59.0 in | Wt 121.4 lb

## 2024-02-23 DIAGNOSIS — Z124 Encounter for screening for malignant neoplasm of cervix: Secondary | ICD-10-CM | POA: Diagnosis not present

## 2024-02-23 DIAGNOSIS — Z01419 Encounter for gynecological examination (general) (routine) without abnormal findings: Secondary | ICD-10-CM | POA: Insufficient documentation

## 2024-02-23 DIAGNOSIS — N920 Excessive and frequent menstruation with regular cycle: Secondary | ICD-10-CM

## 2024-02-23 DIAGNOSIS — Z3200 Encounter for pregnancy test, result unknown: Secondary | ICD-10-CM

## 2024-02-23 DIAGNOSIS — Z113 Encounter for screening for infections with a predominantly sexual mode of transmission: Secondary | ICD-10-CM | POA: Diagnosis present

## 2024-02-23 DIAGNOSIS — R232 Flushing: Secondary | ICD-10-CM

## 2024-02-23 DIAGNOSIS — N946 Dysmenorrhea, unspecified: Secondary | ICD-10-CM | POA: Diagnosis not present

## 2024-02-23 DIAGNOSIS — Z3202 Encounter for pregnancy test, result negative: Secondary | ICD-10-CM

## 2024-02-23 DIAGNOSIS — Z1331 Encounter for screening for depression: Secondary | ICD-10-CM

## 2024-02-23 LAB — POCT URINE PREGNANCY: Preg Test, Ur: NEGATIVE

## 2024-02-23 MED ORDER — NORETHIN-ETH ESTRAD-FE BIPHAS 1 MG-10 MCG / 10 MCG PO TABS
1.0000 | ORAL_TABLET | Freq: Every day | ORAL | 11 refills | Status: AC
Start: 2024-02-23 — End: ?

## 2024-02-23 NOTE — Progress Notes (Signed)
 Pt presents for AEX Last PAP unknown  Requesting STD testing  Pt reports heavy, painful periods, hot flashes   Pt c/o intermittent abd pain on the left side
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
## 2024-02-24 LAB — CBC
Hematocrit: 39.8 % (ref 34.0–46.6)
Hemoglobin: 12.8 g/dL (ref 11.1–15.9)
MCH: 28.3 pg (ref 26.6–33.0)
MCHC: 32.2 g/dL (ref 31.5–35.7)
MCV: 88 fL (ref 79–97)
Platelets: 411 x10E3/uL (ref 150–450)
RBC: 4.53 x10E6/uL (ref 3.77–5.28)
RDW: 13.2 % (ref 11.7–15.4)
WBC: 12.9 x10E3/uL — ABNORMAL HIGH (ref 3.4–10.8)

## 2024-02-24 LAB — COMPREHENSIVE METABOLIC PANEL WITH GFR
ALT: 7 IU/L (ref 0–32)
AST: 13 IU/L (ref 0–40)
Albumin: 4.4 g/dL (ref 3.9–4.9)
Alkaline Phosphatase: 72 IU/L (ref 41–116)
BUN/Creatinine Ratio: 13 (ref 9–23)
BUN: 9 mg/dL (ref 6–20)
Bilirubin Total: 0.6 mg/dL (ref 0.0–1.2)
CO2: 20 mmol/L (ref 20–29)
Calcium: 9.4 mg/dL (ref 8.7–10.2)
Chloride: 100 mmol/L (ref 96–106)
Creatinine, Ser: 0.72 mg/dL (ref 0.57–1.00)
Globulin, Total: 3.2 g/dL (ref 1.5–4.5)
Glucose: 78 mg/dL (ref 70–99)
Potassium: 3.9 mmol/L (ref 3.5–5.2)
Sodium: 136 mmol/L (ref 134–144)
Total Protein: 7.6 g/dL (ref 6.0–8.5)
eGFR: 110 mL/min/1.73 (ref >59)

## 2024-02-24 LAB — HEMOGLOBIN A1C
Est. average glucose Bld gHb Est-mCnc: 94 mg/dL
Hgb A1c MFr Bld: 4.9 % (ref 4.8–5.6)

## 2024-02-24 LAB — CERVICOVAGINAL ANCILLARY ONLY
Chlamydia: NEGATIVE
Comment: NEGATIVE
Comment: NEGATIVE
Comment: NORMAL
Neisseria Gonorrhea: NEGATIVE
Trichomonas: NEGATIVE

## 2024-02-24 LAB — HEPATITIS B SURFACE ANTIGEN: Hepatitis B Surface Ag: NEGATIVE

## 2024-02-24 LAB — TSH RFX ON ABNORMAL TO FREE T4: TSH: 1.31 u[IU]/mL (ref 0.450–4.500)

## 2024-02-24 LAB — HEPATITIS C ANTIBODY: Hep C Virus Ab: NONREACTIVE

## 2024-02-24 LAB — HIV ANTIBODY (ROUTINE TESTING W REFLEX): HIV Screen 4th Generation wRfx: NONREACTIVE

## 2024-02-24 LAB — RPR: RPR Ser Ql: NONREACTIVE

## 2024-02-26 ENCOUNTER — Ambulatory Visit: Payer: Self-pay | Admitting: Obstetrics and Gynecology

## 2024-02-27 LAB — CYTOLOGY - PAP
Comment: NEGATIVE
Diagnosis: NEGATIVE
High risk HPV: NEGATIVE
